# Patient Record
Sex: Male | Born: 1960 | Race: White | Hispanic: No | Marital: Single | Smoking: Current every day smoker
Health system: Southern US, Community
[De-identification: ages and names within clinical notes are randomized; demographics above are authoritative.]

## PROBLEM LIST (undated history)

## (undated) DIAGNOSIS — R42 Dizziness and giddiness: Secondary | ICD-10-CM

## (undated) DIAGNOSIS — C3490 Malignant neoplasm of unspecified part of unspecified bronchus or lung: Secondary | ICD-10-CM

## (undated) DIAGNOSIS — C719 Malignant neoplasm of brain, unspecified: Secondary | ICD-10-CM

## (undated) HISTORY — DX: Malignant neoplasm of brain, unspecified: C71.9

## (undated) HISTORY — DX: Malignant neoplasm of unspecified part of unspecified bronchus or lung: C34.90

---

## 1999-08-19 ENCOUNTER — Ambulatory Visit (HOSPITAL_COMMUNITY): Admission: RE | Admit: 1999-08-19 | Discharge: 1999-08-19 | Payer: Self-pay | Admitting: Family Medicine

## 1999-08-19 ENCOUNTER — Encounter: Payer: Self-pay | Admitting: Family Medicine

## 2014-09-10 ENCOUNTER — Ambulatory Visit (INDEPENDENT_AMBULATORY_CARE_PROVIDER_SITE_OTHER): Payer: Managed Care, Other (non HMO) | Admitting: Family Medicine

## 2014-09-10 VITALS — BP 136/70 | HR 74 | Temp 98.0°F | Resp 16 | Ht 68.0 in | Wt 148.0 lb

## 2014-09-10 DIAGNOSIS — J0100 Acute maxillary sinusitis, unspecified: Secondary | ICD-10-CM

## 2014-09-10 DIAGNOSIS — R42 Dizziness and giddiness: Secondary | ICD-10-CM

## 2014-09-10 DIAGNOSIS — H6503 Acute serous otitis media, bilateral: Secondary | ICD-10-CM

## 2014-09-10 MED ORDER — LEVOFLOXACIN 500 MG PO TABS: 500.0000 mg | ORAL_TABLET | Freq: Every day | ORAL | Status: DC

## 2014-09-10 NOTE — Progress Notes (Signed)
Patient ID: RHYAN RADLER MRN: [id number], DOB: 1961-02-28, 53 y.o. Date of Encounter: 09/10/2014, 1:04 PM  Primary Physician: No primary care provider on file.  Chief Complaint:  Chief Complaint  Patient presents with  . Ear Fullness    x 2 weeks  . Dizziness    HPI: 53 y.o. year old male presents with 7 day history of nasal congestion, post nasal drip, sore throat, sinus pressure, mild dysequilibrium, and muffled hearing. Afebrile. No chills. Nasal congestion thick and green/yellow. Sinus pressure is the worst symptom. . Ears feel full, leading to sensation of muffled hearing. Has tried OTC cold preps without success. No GI complaints.   He works for [employer], [employer] which sometimes requires going up ladders.  He has felt unstable on the ladders lately  No recent antibiotics, recent travels, vomiting, or sick contacts   No leg trauma, sedentary periods, h/o cancer, or tobacco use.  No past medical history on file.   Home Meds: Prior to Admission medications   Medication Sig Start Date End Date Taking? Authorizing Provider  levofloxacin (LEVAQUIN) 500 MG tablet Take 1 tablet (500 mg total) by mouth daily. 09/10/14   Robyn Haber, MD    Allergies:  Allergies  Allergen Reactions  . Penicillins     History   Social History  . Marital Status: Divorced    Spouse Name: N/A    Number of Children: N/A  . Years of Education: N/A   Occupational History  . Not on file.   Social History Main Topics  . Smoking status: Never Smoker   . Smokeless tobacco: Not on file  . Alcohol Use: Not on file  . Drug Use: Not on file  . Sexual Activity: Not on file   Other Topics Concern  . Not on file   Social History Narrative  . No narrative on file     Review of Systems: Constitutional: negative for chills, fever, night sweats or weight changes Cardiovascular: negative for chest pain or palpitations Respiratory: negative for hemoptysis, wheezing, or  shortness of breath Abdominal: negative for abdominal pain, nausea, vomiting or diarrhea Dermatological: negative for rash Neurologic: negative for headache   Physical Exam: Blood pressure 136/70, pulse 74, temperature 98 F (36.7 C), resp. rate 16, height 5\' 8"  (1.727 m), weight 148 lb (67.132 kg), SpO2 99 %., Body mass index is 22.51 kg/(m^2). General: Well developed, well nourished, in no acute distress. Head: Normocephalic, atraumatic, eyes without discharge, sclera non-icteric, nares are congested. Bilateral auditory canals clear, TM's are without perforation, pearly grey with reflective cone of light bilaterally. Serous effusion bilaterally behind TM's. Maxillary sinus TTP. Oral cavity moist, dentition normal. Posterior pharynx with post nasal drip and mild erythema. No peritonsillar abscess or tonsillar exudate. Neck: Supple. No thyromegaly. Full ROM. No lymphadenopathy. Lungs: Clear bilaterally to auscultation without wheezes, rales, or rhonchi. Breathing is unlabored.  Heart: RRR with S1 S2. No murmurs, rubs, or gallops appreciated. Msk:  Strength and tone normal for age. Extremities: No clubbing or cyanosis. No edema. Neuro: Alert and oriented X 3. Moves all extremities spontaneously. CNII-XII grossly in tact.  CN III-XII intact.  Gait stable Psych:  Responds to questions appropriately with a normal affect.   Labs:   ASSESSMENT AND PLAN:  53 y.o. year old male with sinusitis -Acute maxillary sinusitis, recurrence not specified - Plan: levofloxacin (LEVAQUIN) 500 MG tablet, DISCONTINUED: levofloxacin (LEVAQUIN) 500 MG tablet  Bilateral acute serous otitis media, recurrence not specified - Plan: levofloxacin (LEVAQUIN)  500 MG tablet, DISCONTINUED: levofloxacin (LEVAQUIN) 500 MG tablet  I have recommended the patient not go on ladders and x-ray days. If he still having symptoms by Sunday, he still return for further evaluation.  -Tylenol/Motrin prn -Rest/fluids -RTC  precautions -RTC 3-5 days if no improvement  Signed, Robyn Haber, MD 09/10/2014 1:04 PM

## 2014-09-10 NOTE — Patient Instructions (Signed)
Serous Otitis Media Serous otitis media is fluid in the middle ear space. This space contains the bones for hearing and air. Air in the middle ear space helps to transmit sound.  The air gets there through the eustachian tube. This tube goes from the back of the nose (nasopharynx) to the middle ear space. It keeps the pressure in the middle ear the same as the outside world. It also helps to drain fluid from the middle ear space. CAUSES  Serous otitis media occurs when the eustachian tube gets blocked. Blockage can come from:  Ear infections.  Colds and other upper respiratory infections.  Allergies.  Irritants such as cigarette smoke.  Sudden changes in air pressure (such as descending in an airplane).  Enlarged adenoids.  A mass in the nasopharynx. During colds and upper respiratory infections, the middle ear space can become temporarily filled with fluid. This can happen after an ear infection also. Once the infection clears, the fluid will generally drain out of the ear through the eustachian tube. If it does not, then serous otitis media occurs. SIGNS AND SYMPTOMS   Hearing loss.  A feeling of fullness in the ear, without pain.  Young children may not show any symptoms but may show slight behavioral changes, such as agitation, ear pulling, or crying. DIAGNOSIS  Serous otitis media is diagnosed by an ear exam. Tests may be done to check on the movement of the eardrum. Hearing exams may also be done. TREATMENT  The fluid most often goes away without treatment. If allergy is the cause, allergy treatment may be helpful. Fluid that persists for several months may require minor surgery. A small tube is placed in the eardrum to:  Drain the fluid.  Restore the air in the middle ear space. In certain situations, antibiotic medicines are used to avoid surgery. Surgery may be done to remove enlarged adenoids (if this is the cause). HOME CARE INSTRUCTIONS   Keep children away from  tobacco smoke.  Keep all follow-up visits as directed by your health care provider. SEEK MEDICAL CARE IF:   Your hearing is not better in 3 months.  Your hearing is worse.  You have ear pain.  You have drainage from the ear.  You have dizziness.  You have serous otitis media only in one ear or have any bleeding from your nose (epistaxis).  You notice a lump on your neck. MAKE SURE YOU:  Understand these instructions.   Will watch your condition.   Will get help right away if you are not doing well or get worse.  Document Released: 12/23/2003 Document Revised: 02/16/2014 Document Reviewed: 04/29/2013 [facility] Patient Information 2015 [place], [place]. This information is not intended to replace advice given to you by your health care provider. Make sure you discuss any questions you have with your health care provider. Sinusitis Sinusitis is redness, soreness, and inflammation of the paranasal sinuses. Paranasal sinuses are air pockets within the bones of your face (beneath the eyes, the middle of the forehead, or above the eyes). In healthy paranasal sinuses, mucus is able to drain out, and air is able to circulate through them by way of your nose. However, when your paranasal sinuses are inflamed, mucus and air can become trapped. This can allow bacteria and other germs to grow and cause infection. Sinusitis can develop quickly and last only a short time (acute) or continue over a long period (chronic). Sinusitis that lasts for more than 12 weeks is considered chronic.  CAUSES  Causes of sinusitis include:  Allergies.  Structural abnormalities, such as displacement of the cartilage that separates your nostrils (deviated septum), which can decrease the air flow through your nose and sinuses and affect sinus drainage.  Functional abnormalities, such as when the small hairs (cilia) that line your sinuses and help remove mucus do not work properly or are not present. SIGNS AND  SYMPTOMS  Symptoms of acute and chronic sinusitis are the same. The primary symptoms are pain and pressure around the affected sinuses. Other symptoms include:  Upper toothache.  Earache.  Headache.  Bad breath.  Decreased sense of smell and taste.  A cough, which worsens when you are lying flat.  Fatigue.  Fever.  Thick drainage from your nose, which often is green and may contain pus (purulent).  Swelling and warmth over the affected sinuses. DIAGNOSIS  Your health care provider will perform a physical exam. During the exam, your health care provider may:  Look in your nose for signs of abnormal growths in your nostrils (nasal polyps).  Tap over the affected sinus to check for signs of infection.  View the inside of your sinuses (endoscopy) using an imaging device that has a light attached (endoscope). If your health care provider suspects that you have chronic sinusitis, one or more of the following tests may be recommended:  Allergy tests.  Nasal culture. A sample of mucus is taken from your nose, sent to a lab, and screened for bacteria.  Nasal cytology. A sample of mucus is taken from your nose and examined by your health care provider to determine if your sinusitis is related to an allergy. TREATMENT  Most cases of acute sinusitis are related to a viral infection and will resolve on their own within 10 days. Sometimes medicines are prescribed to help relieve symptoms (pain medicine, decongestants, nasal steroid sprays, or saline sprays).  However, for sinusitis related to a bacterial infection, your health care provider will prescribe antibiotic medicines. These are medicines that will help kill the bacteria causing the infection.  Rarely, sinusitis is caused by a fungal infection. In theses cases, your health care provider will prescribe antifungal medicine. For some cases of chronic sinusitis, surgery is needed. Generally, these are cases in which sinusitis recurs  more than 3 times per year, despite other treatments. HOME CARE INSTRUCTIONS   Drink plenty of water. Water helps thin the mucus so your sinuses can drain more easily.  Use a humidifier.  Inhale steam 3 to 4 times a day (for example, sit in the bathroom with the shower running).  Apply a warm, moist washcloth to your face 3 to 4 times a day, or as directed by your health care provider.  Use saline nasal sprays to help moisten and clean your sinuses.  Take medicines only as directed by your health care provider.  If you were prescribed either an antibiotic or antifungal medicine, finish it all even if you start to feel better. SEEK IMMEDIATE MEDICAL CARE IF:  You have increasing pain or severe headaches.  You have nausea, vomiting, or drowsiness.  You have swelling around your face.  You have vision problems.  You have a stiff neck.  You have difficulty breathing. MAKE SURE YOU:   Understand these instructions.  Will watch your condition.  Will get help right away if you are not doing well or get worse. Document Released: 10/02/2005 Document Revised: 02/16/2014 Document Reviewed: 10/17/2011 [facility] Patient Information 2015 [place], [place]. This information is not intended to replace  advice given to you by your health care provider. Make sure you discuss any questions you have with your health care provider.

## 2014-09-16 ENCOUNTER — Ambulatory Visit (INDEPENDENT_AMBULATORY_CARE_PROVIDER_SITE_OTHER): Payer: Managed Care, Other (non HMO) | Admitting: Family Medicine

## 2014-09-16 VITALS — BP 135/82 | HR 85 | Temp 99.1°F | Resp 18 | Wt 146.0 lb

## 2014-09-16 DIAGNOSIS — R42 Dizziness and giddiness: Secondary | ICD-10-CM

## 2014-09-16 DIAGNOSIS — H9319 Tinnitus, unspecified ear: Secondary | ICD-10-CM

## 2014-09-16 NOTE — Progress Notes (Signed)
Subjective:    Patient ID: Jason Keller, male    DOB: [birth year], 53 y.o.   MRN: [id number] Chief Complaint  Patient presents with  . Follow-up  . Dizziness    HPI 53 year old male smoker, and alcohol abuse, is returning after 7 days of levaquin for sinusitis and otitis media effusion after complaints of sinus pressure and dysequilibrium.  He states that though the equilibrium has improved some, he continues to have the constant roaring tinnitus, although there is definite intensity when he is driving and when he is in loud environments.  It may crescendo at any time of day with no rhyme or reason as well.  He states that he feels like his feet are huge, as though he is dragging, because he can't get his feet under him.  He denies fatigue or weakness with this.  Last night, he states he began to have chills, and went to bed around 5:30.  He has dizziness during the day.    He has been taking advil (600mg  twice a day) which helps.  He has also used an otc ear solution that helped, but he is unsure the name of the treatment.  He denies cp, palpitations, or leg swellings. He also states he feels tremulous.  He denies ever having withdrawal, but has only drank 2 beers last night from his normal 6.  He smokes about 1 pk/day.  Caffeine drinker of 1 cup of coffee.      Review of Systems ROS otherwise unremarkable unless listed above.      Objective:   Physical Exam  Constitutional: He is oriented to person, place, and time. He appears well-developed and well-nourished.  HENT:  Head: Normocephalic and atraumatic.  Nose: Nose normal. Right sinus exhibits no maxillary sinus tenderness and no frontal sinus tenderness. Left sinus exhibits no maxillary sinus tenderness and no frontal sinus tenderness.  Mouth/Throat: Oropharynx is clear and moist. No trismus in the jaw. No uvula swelling. No oropharyngeal exudate, posterior oropharyngeal edema or posterior oropharyngeal erythema.  Tympanic membranes  are a pearly grey.  Some cerumen speckled along the eardrum.  No effusions, injection, erythema or bulging bilaterally of both ears.   Nasal mucosa moist with no edema or rhinorrhea.    Eyes: Pupils are equal, round, and reactive to light. No scleral icterus.  Neck: Normal range of motion. No tracheal deviation present.  Right anterior adenopathy.    Cardiovascular: Normal rate, regular rhythm and normal heart sounds.  Exam reveals no gallop, no distant heart sounds and no friction rub.   No murmur heard. Pulmonary/Chest: Breath sounds normal. No accessory muscle usage. No apnea. No respiratory distress. He has no decreased breath sounds. He has no wheezes. He has no rhonchi.  Neurological: He is alert and oriented to person, place, and time. He displays a negative Romberg sign. Coordination normal.  No nystagmus with maneuvering.  Head thrust showed no trailing from target.   Nose-to-finger heel-to-shin completed successfully.  Heel-to-toe walk normal with some teetering without misstep.    Skin: Skin is warm and dry.       Assessment & Plan:  53 year old male with tobacco and alcohol abuse is here today regarding continued dysequilibrium and tinnitus for 2 weeks.    Reviewed this plan with Dr. Joseph Art.  He continues to have some dizziness, tinnitus, and balance issues after 6 days of a strong antibiotic.  At this time, his symptoms should resolved.  At this time, ent would  be preferred.  Diff dx: Effusion, labyrinthitis, meniere, neuronitis, .  Reluctant to give presumed diuretic with etoh hx, although his numerous lifestyle choices may be the cause of his slow recovery as well.  At this time, ent is appreciated to insure no hearing loss or other etiology.    Dizziness Tinnitus, unspecified laterality Ambulatory referral to ENT    Ivar Drape, PA-C [facility] 12/2/20152:28 PM

## 2014-10-08 ENCOUNTER — Emergency Department (HOSPITAL_COMMUNITY): Payer: PRIVATE HEALTH INSURANCE

## 2014-10-08 ENCOUNTER — Emergency Department (HOSPITAL_COMMUNITY)
Admission: EM | Admit: 2014-10-08 | Discharge: 2014-10-08 | Disposition: A | Discharge status: Home / Self Care | Payer: PRIVATE HEALTH INSURANCE | Attending: Emergency Medicine | Admitting: Emergency Medicine

## 2014-10-08 ENCOUNTER — Encounter (HOSPITAL_COMMUNITY): Payer: Self-pay | Admitting: Emergency Medicine

## 2014-10-08 DIAGNOSIS — R531 Weakness: Secondary | ICD-10-CM | POA: Insufficient documentation

## 2014-10-08 DIAGNOSIS — Z720 Tobacco use: Secondary | ICD-10-CM | POA: Diagnosis not present

## 2014-10-08 DIAGNOSIS — Z880 Allergy status to penicillin: Secondary | ICD-10-CM | POA: Diagnosis not present

## 2014-10-08 DIAGNOSIS — Z859 Personal history of malignant neoplasm, unspecified: Secondary | ICD-10-CM | POA: Insufficient documentation

## 2014-10-08 DIAGNOSIS — R42 Dizziness and giddiness: Secondary | ICD-10-CM

## 2014-10-08 DIAGNOSIS — R222 Localized swelling, mass and lump, trunk: Secondary | ICD-10-CM

## 2014-10-08 DIAGNOSIS — C7931 Secondary malignant neoplasm of brain: Secondary | ICD-10-CM | POA: Insufficient documentation

## 2014-10-08 DIAGNOSIS — R413 Other amnesia: Secondary | ICD-10-CM

## 2014-10-08 DIAGNOSIS — R930 Abnormal findings on diagnostic imaging of skull and head, not elsewhere classified: Secondary | ICD-10-CM | POA: Insufficient documentation

## 2014-10-08 HISTORY — DX: Dizziness and giddiness: R42

## 2014-10-08 LAB — CBC WITH DIFFERENTIAL/PLATELET
Basophils Absolute: 0 10*3/uL (ref 0.0–0.1)
Basophils Relative: 1 % (ref 0–1)
Eosinophils Absolute: 0.2 10*3/uL (ref 0.0–0.7)
Eosinophils Relative: 3 % (ref 0–5)
HCT: 41 % (ref 39.0–52.0)
HEMOGLOBIN: 14.3 g/dL (ref 13.0–17.0)
LYMPHS ABS: 2 10*3/uL (ref 0.7–4.0)
Lymphocytes Relative: 31 % (ref 12–46)
MCH: 31.1 pg (ref 26.0–34.0)
MCHC: 34.9 g/dL (ref 30.0–36.0)
MCV: 89.1 fL (ref 78.0–100.0)
MONOS PCT: 7 % (ref 3–12)
Monocytes Absolute: 0.5 10*3/uL (ref 0.1–1.0)
NEUTROS ABS: 3.9 10*3/uL (ref 1.7–7.7)
NEUTROS PCT: 58 % (ref 43–77)
Platelets: 208 10*3/uL (ref 150–400)
RBC: 4.6 MIL/uL (ref 4.22–5.81)
RDW: 12.6 % (ref 11.5–15.5)
WBC: 6.6 10*3/uL (ref 4.0–10.5)

## 2014-10-08 LAB — URINALYSIS, ROUTINE W REFLEX MICROSCOPIC
BILIRUBIN URINE: NEGATIVE
Glucose, UA: NEGATIVE mg/dL
HGB URINE DIPSTICK: NEGATIVE
Ketones, ur: NEGATIVE mg/dL
Leukocytes, UA: NEGATIVE
NITRITE: NEGATIVE
Protein, ur: NEGATIVE mg/dL
Specific Gravity, Urine: 1.004 — ABNORMAL LOW (ref 1.005–1.030)
Urobilinogen, UA: 0.2 mg/dL (ref 0.0–1.0)
pH: 6 (ref 5.0–8.0)

## 2014-10-08 LAB — COMPREHENSIVE METABOLIC PANEL
ALBUMIN: 3.9 g/dL (ref 3.5–5.2)
ALK PHOS: 44 U/L (ref 39–117)
ALT: 16 U/L (ref 0–53)
ANION GAP: 11 (ref 5–15)
AST: 18 U/L (ref 0–37)
BUN: 11 mg/dL (ref 6–23)
CO2: 21 mmol/L (ref 19–32)
Calcium: 9.2 mg/dL (ref 8.4–10.5)
Chloride: 106 mEq/L (ref 96–112)
Creatinine, Ser: 0.74 mg/dL (ref 0.50–1.35)
GFR calc non Af Amer: >90 mL/min (ref >90)
GLUCOSE: 90 mg/dL (ref 70–99)
POTASSIUM: 3.9 mmol/L (ref 3.5–5.1)
Sodium: 138 mmol/L (ref 135–145)
Total Bilirubin: 0.4 mg/dL (ref 0.3–1.2)
Total Protein: 6.5 g/dL (ref 6.0–8.3)

## 2014-10-08 MED ORDER — GADOBENATE DIMEGLUMINE 529 MG/ML IV SOLN
14.0000 mL | Freq: Once | INTRAVENOUS | Status: AC | PRN
  Administered 2014-10-08: 14 mL via INTRAVENOUS

## 2014-10-08 NOTE — Discharge Instructions (Signed)
1. Medications: usual home medications 2. Treatment: rest, drink plenty of fluids,  3. Follow Up: Please followup with [facility] or [facility] hematology [facility] oncology as soon as possible; please return to the emergency department for worsening symptoms, seizure-like activity, persistent headaches or changes in vision.

## 2014-10-08 NOTE — ED Notes (Signed)
Pt to ED for evaluation of ongoing memory loss for the past 4 months- pt states "I feel like I'm dragging".  Pt recently dx with Vertigo by ENT but states "I think it's something more".  Pt alert and oriented X 4 at present, denies numbness or tingling.

## 2014-10-08 NOTE — ED Notes (Signed)
Dr. Gentry at bedside. 

## 2014-10-08 NOTE — ED Notes (Signed)
Patient transported to CT 

## 2014-10-08 NOTE — ED Provider Notes (Signed)
CSN: [id number]     Arrival date & time 10/08/14  1843 History   First MD Initiated Contact with Patient 10/08/14 1852     Chief Complaint  Patient presents with  . Memory Loss     (Consider location/radiation/quality/duration/timing/severity/associated sxs/prior Treatment) The history is provided by the patient, medical records and a significant other. No language interpreter was used.     Jason Keller is a 53 y.o. male  with a hx of vertigo presents to the Emergency Department complaining of gradual, persistent, progressively worsening memory loss onset 4 months ago.  Pt reports he was evaluated at a local urgent care who diagnosed him with vertigo and sent him to be evaluated by ENT who confirm this diagnosis.  Patient denies room spinning, nausea but reports that his symptoms are more like disorientation. 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 Patient significant other reports the patient sometimes appears confused for several minutes at a time. With patient and significant other deny seizure activity, labile mood.  Patient reports that he feels very tired like he is "dragging." He reports sleeping poorly and significant other endorses symptoms consistent with sleep apnea. Patient denies headaches, neck pain, chest pain, shortness of breath, abdominal pain, nausea, vomiting, diarrhea, syncope, dysuria, hematuria.  Patient reports he is a smoker, currently smoking everyday. He reports chronic cough with this but unchanged.     Past Medical History  Diagnosis Date  . Vertigo    History reviewed. No pertinent past surgical history. No family history on file. History  Substance Use Topics  . Smoking status: Current Every Day Smoker  . Smokeless tobacco: Not on file  . Alcohol Use: 0.0 oz/week    0 Not specified per week    Review of Systems  Constitutional: Positive for fatigue. Negative for fever, diaphoresis, appetite change and unexpected weight  change.  HENT: Negative for mouth sores.   Eyes: Negative for visual disturbance.  Respiratory: Negative for cough, chest tightness, shortness of breath and wheezing.   Cardiovascular: Negative for chest pain.  Gastrointestinal: Negative for nausea, vomiting, abdominal pain, diarrhea and constipation.  Endocrine: Negative for polydipsia, polyphagia and polyuria.  Genitourinary: Negative for dysuria, urgency, frequency and hematuria.  Musculoskeletal: Negative for back pain and neck stiffness.  Skin: Negative for rash.  Allergic/Immunologic: Negative for immunocompromised state.  Neurological: Negative for syncope, light-headedness and headaches.       Disorientation  Hematological: Does not bruise/bleed easily.  Psychiatric/Behavioral: Positive for confusion. Negative for sleep disturbance. The patient is not nervous/anxious.       Allergies  Penicillins  Home Medications   Prior to Admission medications   Medication Sig Start Date End Date Taking? Authorizing Provider  meclizine (ANTIVERT) 12.5 MG tablet Take 12.5 mg by mouth 2 (two) times daily as needed for dizziness.   Yes Historical Provider, MD   BP 141/91 mmHg  Pulse 73  Temp(Src) 97.9 F (36.6 C) (Oral)  Resp 18  Ht 5\' 10"  (1.778 m)  Wt 146 lb (66.225 kg)  BMI 20.95 kg/m2  SpO2 97% Physical Exam  Constitutional: He is oriented to person, place, and time. He appears well-developed and well-nourished. No distress.  Awake, alert, nontoxic appearance  HENT:  Head: Normocephalic and atraumatic.  Mouth/Throat: Oropharynx is clear and moist. No oropharyngeal exudate.  Eyes: Conjunctivae and EOM are normal. Pupils are equal, round, and reactive to light. No scleral icterus.  No horizontal, vertical or rotational nystagmus  Neck: Normal  range of motion. Neck supple.  Full active and passive ROM without pain No midline or paraspinal tenderness No nuchal rigidity or meningeal signs  Cardiovascular: Normal rate, regular  rhythm, normal heart sounds and intact distal pulses.   No murmur heard. Pulmonary/Chest: Effort normal and breath sounds normal. No respiratory distress. He has no wheezes. He has no rales.  Equal chest expansion  Abdominal: Soft. Bowel sounds are normal. He exhibits no mass. There is no tenderness. There is no rebound and no guarding.  Musculoskeletal: Normal range of motion. He exhibits no edema.  Lymphadenopathy:    He has no cervical adenopathy.  Neurological: He is alert and oriented to person, place, and time. He has normal reflexes. No cranial nerve deficit. He exhibits normal muscle tone. Coordination normal.  Mental Status:  Alert, oriented, thought content appropriate. Speech fluent without evidence of aphasia. Able to follow 2 step commands without difficulty.  Cranial Nerves:  II:  Peripheral visual fields grossly normal, pupils equal, round, reactive to light III,IV, VI: ptosis not present, extra-ocular motions intact bilaterally  V,VII: smile symmetric, facial light touch sensation equal VIII: hearing grossly normal bilaterally  IX,X: gag reflex present  XI: bilateral shoulder shrug equal and strong XII: midline tongue extension  Motor:  5/5 in upper and lower extremities bilaterally including strong and equal grip strength and dorsiflexion/plantar flexion Sensory: Pinprick and light touch normal in all extremities.  Deep Tendon Reflexes: 2+ and symmetric  Cerebellar: normal finger-to-nose with bilateral upper extremities Gait: normal gait and balance CV: distal pulses palpable throughout   Skin: Skin is warm and dry. No rash noted. He is not diaphoretic. No erythema.  Psychiatric: He has a normal mood and affect. His behavior is normal. Judgment and thought content normal.  Nursing note and vitals reviewed.   ED Course  Procedures (including critical care time) Labs Review Labs Reviewed  URINALYSIS, ROUTINE W REFLEX MICROSCOPIC - Abnormal; Notable for the following:     Specific Gravity, Urine 1.004 (*)    All other components within normal limits  CBC WITH DIFFERENTIAL  COMPREHENSIVE METABOLIC PANEL    Imaging Review Dg Chest 2 View  10/08/2014   CLINICAL DATA:  Headache and dizziness for 6 weeks, decreased memory, feet tracking RIGHT greater than LEFT, smoker, weakness  EXAM: CHEST  2 VIEW  COMPARISON:  None  FINDINGS: Normal heart size, mediastinal contours and pulmonary vascularity.  Emphysematous and minimal bronchitic changes consistent with COPD.  RIGHT perihilar density suspicious for mass, area approximately 4.9 x 4.7 cm.  Remaining lungs clear.  No pleural effusion or pneumothorax.  IMPRESSION: COPD changes with suspicion of a perihilar mass in the RIGHT upper lobe 4.9 x 4.7 cm ; CT chest with contrast recommended for further evaluation.   Electronically Signed   By: Lavonia Dana M.D.   On: 10/08/2014 22:19   Ct Head Wo Contrast  10/08/2014   CLINICAL DATA:  Frontal headache for 3 months, dizziness for 3 months  EXAM: CT HEAD WITHOUT CONTRAST  TECHNIQUE: Contiguous axial images were obtained from the base of the skull through the vertex without intravenous contrast.  COMPARISON:  None.  FINDINGS: Areas of extensive white matter hypodensity are noted bilaterally. There is a possible associated 2.1 cm mass with adjacent white matter edema in the right occipital lobe image 17. There is mild subjective adjacent sulcal effacement. No midline shift. No acute hemorrhage. Evidence of scleral banding noted. No skull fracture. Moderate pansinusitis. No lytic or sclerotic osseous lesion.  IMPRESSION: Multiple bilateral areas of white matter presumed edema with suspicion for underlying mass lesions, statistically most likely metastatic disease. No acute hemorrhage. Infection or inflammation is less likely but possible. Further evaluation with brain MRI with contrast is recommended.  These results were called by telephone at the time of interpretation on 10/08/2014 at  8:21 pm to Dr. Abigail Butts , who verbally acknowledged these results.   Electronically Signed   By: Conchita Paris M.D.   On: 10/08/2014 20:23   Mr Jeri Cos ZO Contrast  10/08/2014   CLINICAL DATA:  Initial evaluation for memory loss with vertigo. Abnormal CT earlier today. History of smoking.  EXAM: MRI HEAD WITHOUT AND WITH CONTRAST  TECHNIQUE: Multiplanar, multiecho pulse sequences of the brain and surrounding structures were obtained without and with intravenous contrast.  CONTRAST:  64mL MULTIHANCE GADOBENATE DIMEGLUMINE 529 MG/ML IV SOLN  COMPARISON:  Prior CT from earlier the same day.  FINDINGS: Multiple abnormal heterogeneously enhancing mass is seen scattered throughout the brain, most consistent with intracranial metastases. These lesions are seen as follows:  Largest lesion located within the periatrial white matter in the right temporal parietal region measuring 3.5 x 1.9 x 2.7 cm (series 12, image 29). There is an adjacent lesion just inferiorly measuring 2.2 x 2.7 x 1.6 cm. There is extensive vasogenic edema about these lesions with partial effacement of the right lateral ventricle.  Lesion within the parasagittal left frontal lobe near the vertex measures 2.1 x 2.1 x 2.8 cm (series 12, image 45). Extensive vasogenic edema present about this lesion as well.  1.9 x 1.9 x 1.8 cm lesion present within the peripheral right frontal lobe towards the vertex (series 12, image 40).  1.4 x 1.9 x 1.3 cm lesion in the right frontoparietal region (series 2, image 41). Probable additional punctate lesions more within the left parietal lobe (series 12, image 43) and left frontoparietal region (series 12, image 36). There is a 4 mm lesion more anteriorly within the left frontal lobe (series 12, image 31). Faint punctate enhancement within the anterior right frontal lobe on series 12, image 29 also suspicious for metastatic lesion.  1.2 x 1.6 x 1.6 cm lesion present within the left occipital lobe (series  12, image 22).  11 mm lesion present within the left cerebellar vermis just to the left of midline (series 12, image 19).  8 mm lesion present with at the inferomedial left cerebellar hemisphere (series 12, image 10).  The right periatrial and left frontal lobe lesions demonstrate central necrosis with associated restricted diffusion. Chronic blood products seen within several of these lesions on gradient echo sequence.  There is mild 3-4 mm of right-to-left midline shift at the level of the septum pellucidum. No hydrocephalus. Basilar cisterns remain patent.  No acute large vessel territory infarct.  Craniocervical junction within normal limits. Pituitary gland normal. Visualized upper cervical spine unremarkable.  Bone marrow signal intensity is within normal limits. No definite calvarial or osseous lesions.  No acute abnormality seen about the orbits.  Scattered mucoperiosteal thickening present within the ethmoidal air cells and maxillary sinuses. There is partial opacification with air-fluid level within knee inferior right frontal sinus. Mild opacity present within the left sphenoid sinus. No mastoid effusion.  IMPRESSION: 1. Multiple intracranial metastases with associated vasogenic edema as detailed above. 2. 3-4 mm of right-to-left midline shift.  No hydrocephalus. 3. Paranasal sinus disease as above. Air-fluid level within the right frontal sinus suggest active sinus infection.   Electronically Signed  By: Jeannine Boga M.D.   On: 10/08/2014 22:18     EKG Interpretation None      MDM   Final diagnoses:  Dizziness  Weakness  Memory loss  Abnormal head CT  Metastatic cancer to brain  Mass in chest    Jason Keller presents with vague symptoms of disorientation, confusion and feeling tired. These are perhaps secondary to his sleep apnea however I'm concerned about potential CVA symptoms. Will obtain CT without contrast, basic labs, chest x-ray and reassess.  8:35PM CT with  multiple bilateral areas of white matter edema with suspicion for underlying mass lesions concerning for metastatic disease. Discussed with patient he denies known history of cancer but at this time does endorse a 9 pound weight loss in the last 4 months along with persistent night sweats.  Will obtain MRI brain. Labs reassuring and chest x-ray pending.  10:35PM Chest x-ray with COPD changes and perihilar mass of the right upper lobe.  MRI with multiple intracranial metastasis with associated vasogenic edema, 3-4 mm of right-to-left midline shift without hydrocephalus.  Findings of chest x-ray and MRI are discussed with patient and family. Patient's been offered admission to the hospital for further evaluation. He declines at this time. Patient has been instructed to follow-up as soon as possible with oncology. He's been given resources to do so. Patient is alert, oriented, nontoxic and nonseptic appearing. He remains without focal neurologic deficit and without seizure activity here in the emergency department. Patient cautioned about the potential for seizure activity and reasons to return to the emergency department.  I have personally reviewed patient's vitals, nursing note and any pertinent labs or imaging.  I performed an undressed physical exam.    It has been determined that no acute conditions requiring further emergency intervention are present at this time. The patient/guardian have been advised of the diagnosis and plan. I reviewed all labs and imaging including any potential incidental findings. We have discussed signs and symptoms that warrant return to the ED and they are listed in the discharge instructions.    Vital signs are stable at discharge.   BP 141/91 mmHg  Pulse 73  Temp(Src) 97.9 F (36.6 C) (Oral)  Resp 18  Ht 5\' 10"  (1.778 m)  Wt 146 lb (66.225 kg)  BMI 20.95 kg/m2  SpO2 97%   The patient was discussed with and seen by Dr. Colin Rhein who agrees with the treatment  plan.     Jarrett Soho Aniela Caniglia, PA-C 10/09/14 0001  Debby Freiberg, MD 10/12/14 [phone]

## 2014-10-08 NOTE — ED Notes (Signed)
Gave pt a urinal to provide [place] a urinal sample. Informed pt to hit the call button when he's ready for it to be collected.

## 2014-10-14 ENCOUNTER — Ambulatory Visit (INDEPENDENT_AMBULATORY_CARE_PROVIDER_SITE_OTHER): Payer: PRIVATE HEALTH INSURANCE | Admitting: Internal Medicine

## 2014-10-14 ENCOUNTER — Encounter: Payer: Self-pay | Admitting: Internal Medicine

## 2014-10-14 VITALS — BP 120/84 | HR 77 | Ht 70.0 in | Wt 147.0 lb

## 2014-10-14 DIAGNOSIS — R918 Other nonspecific abnormal finding of lung field: Secondary | ICD-10-CM | POA: Insufficient documentation

## 2014-10-14 DIAGNOSIS — Z720 Tobacco use: Secondary | ICD-10-CM

## 2014-10-14 DIAGNOSIS — C7931 Secondary malignant neoplasm of brain: Secondary | ICD-10-CM

## 2014-10-14 DIAGNOSIS — F17210 Nicotine dependence, cigarettes, uncomplicated: Secondary | ICD-10-CM

## 2014-10-14 MED ORDER — PANTOPRAZOLE SODIUM 40 MG PO TBEC: 40.0000 mg | DELAYED_RELEASE_TABLET | Freq: Every day | ORAL | Status: DC

## 2014-10-14 MED ORDER — FAMOTIDINE 20 MG PO TABS: ORAL_TABLET | ORAL | Status: DC

## 2014-10-14 MED ORDER — HYDROCODONE-ACETAMINOPHEN 5-325 MG PO TABS: 1.0000 | ORAL_TABLET | ORAL | Status: DC | PRN

## 2014-10-14 MED ORDER — DEXAMETHASONE 4 MG PO TABS: 4.0000 mg | ORAL_TABLET | Freq: Four times a day (QID) | ORAL | Status: DC

## 2014-10-14 NOTE — Progress Notes (Signed)
   Subjective:    Patient ID: Jason Keller, male    DOB: Feb 12, 1961,   MRN: [id number]  HPI  53 yowm smoker with min dry cough chronically and new onset veritigo x 06/2014 > to ER xmas eve with HA > MRI pos head mets and R hilar/Upper lobe mass on cxr refeerred by [place] to pulmonary clinic.  10/14/2014 1st [facility] office visit/ Jason Keller   Chief Complaint  Patient presents with  . Pulmonary Consult    Self referral for eval of Lung Mass. Pt c/o loss of balance, non prod cough and unintended wk loss for the past several months.   no sob or hemoptysis, cough was indolent in onset x 6 m persistent daytime urge to clear throat s hoarseness Ha in am x sev week indolent progressively worse, also now noct and at hs but no  Vomiting or viz co's  No obvious other patterns in day to day or daytime variabilty or assoc   cp or chest tightness, subjective wheeze overt sinus or hb symptoms. No unusual exp hx or h/o childhood pna/ asthma or knowledge of premature birth.  Sleeping ok without nocturnal  or early am exacerbation  of respiratory  c/o's or need for noct saba. Also denies any obvious fluctuation of symptoms with weather or environmental changes or other aggravating or alleviating factors except as outlined above   Current Medications, Allergies, Complete Past Medical History, Past Surgical History, Family History, and Social History were reviewed in [employer] record.            Review of Systems  Constitutional: Positive for appetite change and unexpected weight change. Negative for fever, chills and activity change.  HENT: Negative for congestion, dental problem, postnasal drip, rhinorrhea, sneezing, sore throat, trouble swallowing and voice change.   Eyes: Negative for visual disturbance.  Respiratory: Positive for cough. Negative for choking and shortness of breath.   Cardiovascular: Negative for chest pain and leg swelling.  Gastrointestinal:  Negative for nausea, vomiting and abdominal pain.  Genitourinary: Negative for difficulty urinating.  Musculoskeletal: Negative for arthralgias.  Skin: Negative for rash.  Psychiatric/Behavioral: Positive for confusion. Negative for behavioral problems.       Objective:   Physical Exam  Amb wm nad with freq throat clearing   Wt Readings from Last 3 Encounters:  10/14/14 147 lb (66.679 kg)  10/08/14 146 lb (66.225 kg)  09/16/14 146 lb (66.225 kg)    Vital signs reviewed  HEENT: nl dentition, turbinates, and orophanx. Nl external ear canals without cough reflex   NECK :  without JVD/Nodes/TM/ nl carotid upstrokes bilaterally   LUNGS: no acc muscle use, localized wheeze on R anteriorly    CV:  RRR  no s3 or murmur or increase in P2, no edema   ABD:  soft and nontender with nl excursion in the supine position. No bruits or organomegaly, bowel sounds nl  MS:  warm without deformities, calf tenderness, cyanosis - Mod clubbing both hands  SKIN: warm and dry without lesions    NEURO:  alert, approp, no deficits    cxr 10/08/14  COPD changes with suspicion of a perihilar mass in the RIGHT upper lobe 4.9 x 4.7 cm ; CT chest with contrast recommended for further evaluation.    Assessment & Plan:

## 2014-10-14 NOTE — Assessment & Plan Note (Signed)

## 2014-10-14 NOTE — Patient Instructions (Addendum)
No driving or working until further notice  Dexamethasone 4 mg 4x daily   Pantoprazole (protonix) 40 mg   Take 30-60 min before first meal of the day and Pepcid 20 mg one bedtime    As needed for pain try norco-5mg    take Up to one every 4 hours as needed also works for cough   Please see patient coordinator before you leave today  to schedule CT with contrast then we set up for a Bronchoscopy.

## 2014-10-14 NOTE — Assessment & Plan Note (Signed)
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

## 2014-10-14 NOTE — Assessment & Plan Note (Signed)
HA's in am and hs c/w elevated icp   Dex 4 mg qid 10/14/2014 >>> Prn vicodin 5 mg q 4h

## 2014-10-22 ENCOUNTER — Ambulatory Visit (INDEPENDENT_AMBULATORY_CARE_PROVIDER_SITE_OTHER)
Admission: RE | Admit: 2014-10-22 | Discharge: 2014-10-22 | Disposition: A | Discharge status: Home / Self Care | Payer: PRIVATE HEALTH INSURANCE | Source: Ambulatory Visit | Attending: Internal Medicine | Admitting: Internal Medicine

## 2014-10-22 DIAGNOSIS — R918 Other nonspecific abnormal finding of lung field: Secondary | ICD-10-CM

## 2014-10-22 MED ORDER — IOHEXOL 300 MG/ML  SOLN
80.0000 mL | Freq: Once | INTRAMUSCULAR | Status: AC | PRN
  Administered 2014-10-22: 80 mL via INTRAVENOUS

## 2014-10-23 ENCOUNTER — Telehealth: Payer: Self-pay | Admitting: Internal Medicine

## 2014-10-23 DIAGNOSIS — R918 Other nonspecific abnormal finding of lung field: Secondary | ICD-10-CM

## 2014-10-23 MED ORDER — HYDROCODONE-ACETAMINOPHEN 5-325 MG PO TABS: 1.0000 | ORAL_TABLET | ORAL | Status: DC | PRN

## 2014-10-23 NOTE — Telephone Encounter (Signed)
rx has been printed out and signed by MW.   This rx has been placed up front and he is aware that this is ready to be picked up .  Nothing further is needed.

## 2014-10-23 NOTE — Telephone Encounter (Signed)
Ok with me 

## 2014-10-23 NOTE — Telephone Encounter (Signed)
Spoke with pt, is requesting a refill on hydrocodone- refilled 10/14/14 #40 1 tab q4h prn pain.    Pt would be able to pick up this script on Monday.  Dr Melvyn Novas are you ok with this refill?  Thanks!

## 2014-10-26 ENCOUNTER — Encounter (HOSPITAL_COMMUNITY)
Admission: RE | Disposition: A | Discharge status: Home / Self Care | Payer: Self-pay | Source: Ambulatory Visit | Attending: Internal Medicine

## 2014-10-26 ENCOUNTER — Ambulatory Visit (HOSPITAL_COMMUNITY)
Admission: RE | Admit: 2014-10-26 | Discharge: 2014-10-26 | Disposition: A | Discharge status: Home / Self Care | Payer: PRIVATE HEALTH INSURANCE | Source: Ambulatory Visit | Attending: Internal Medicine | Admitting: Internal Medicine

## 2014-10-26 ENCOUNTER — Telehealth: Payer: Self-pay | Admitting: Internal Medicine

## 2014-10-26 DIAGNOSIS — R918 Other nonspecific abnormal finding of lung field: Secondary | ICD-10-CM | POA: Diagnosis present

## 2014-10-26 DIAGNOSIS — C7931 Secondary malignant neoplasm of brain: Secondary | ICD-10-CM | POA: Diagnosis not present

## 2014-10-26 DIAGNOSIS — F17210 Nicotine dependence, cigarettes, uncomplicated: Secondary | ICD-10-CM | POA: Insufficient documentation

## 2014-10-26 HISTORY — PX: VIDEO BRONCHOSCOPY: SHX5072

## 2014-10-26 SURGERY — VIDEO BRONCHOSCOPY WITHOUT FLUORO
Anesthesia: Moderate Sedation | Laterality: Bilateral

## 2014-10-26 MED ORDER — LIDOCAINE HCL 1 % IJ SOLN
INTRAMUSCULAR | Status: DC | PRN
  Administered 2014-10-26: 6 mL via RESPIRATORY_TRACT

## 2014-10-26 MED ORDER — MIDAZOLAM HCL 10 MG/2ML IJ SOLN
INTRAMUSCULAR | Status: AC
  Filled 2014-10-26: qty 4

## 2014-10-26 MED ORDER — MIDAZOLAM HCL 10 MG/2ML IJ SOLN
INTRAMUSCULAR | Status: DC | PRN
  Administered 2014-10-26: 2.5 mg via INTRAVENOUS
  Administered 2014-10-26: 5 mg via INTRAVENOUS

## 2014-10-26 MED ORDER — DEXAMETHASONE 4 MG PO TABS: 4.0000 mg | ORAL_TABLET | Freq: Four times a day (QID) | ORAL | Status: DC

## 2014-10-26 MED ORDER — LIDOCAINE HCL 2 % EX GEL: 1.0000 "application " | Freq: Once | CUTANEOUS | Status: DC

## 2014-10-26 MED ORDER — MEPERIDINE HCL 25 MG/ML IJ SOLN
INTRAMUSCULAR | Status: DC | PRN
  Administered 2014-10-26: 50 mg via INTRAVENOUS

## 2014-10-26 MED ORDER — MIDAZOLAM HCL 5 MG/ML IJ SOLN: 1.0000 mg | Freq: Once | INTRAMUSCULAR | Status: DC

## 2014-10-26 MED ORDER — SODIUM CHLORIDE 0.9 % IV SOLN
INTRAVENOUS | Status: DC
  Administered 2014-10-26: 13:00:00 via INTRAVENOUS

## 2014-10-26 MED ORDER — MEPERIDINE HCL 100 MG/ML IJ SOLN: 100.0000 mg | Freq: Once | INTRAMUSCULAR | Status: DC

## 2014-10-26 MED ORDER — PHENYLEPHRINE HCL 0.25 % NA SOLN: 1.0000 | Freq: Four times a day (QID) | NASAL | Status: DC | PRN

## 2014-10-26 MED ORDER — MEPERIDINE HCL 100 MG/ML IJ SOLN
INTRAMUSCULAR | Status: AC
  Filled 2014-10-26: qty 2

## 2014-10-26 MED ORDER — LIDOCAINE HCL 2 % EX GEL
CUTANEOUS | Status: DC | PRN
  Administered 2014-10-26: 1

## 2014-10-26 MED ORDER — PHENYLEPHRINE HCL 0.25 % NA SOLN
NASAL | Status: DC | PRN
  Administered 2014-10-26: 2 via NASAL

## 2014-10-26 NOTE — Telephone Encounter (Signed)
Pt requesting Decadron and Norco Rx Last seen 10/14/14 Norco #40 printed on 10/23/14 -  Up front for pick up Decradron #60 last filled 10/14/14; pt is requesting refill of this today.   Spoke with Dr Melvyn Novas, okay for Decadron to be refilled to pharmacy. This has been sent to [facility] and patient has picked up Norco rx. Nothing further needed.

## 2014-10-26 NOTE — Op Note (Signed)
Bronchoscopy Procedure Note  Date of Operation: 10/26/2014   Pre-op Diagnosis: lung mass  Post-op Diagnosis: same  Surgeon: [name]  Anesthesia: Monitored Local Anesthesia with Sedation  Operation: Video Flexible fiberoptic bronchoscopy, diagnostic   Findings: RUL ant segment completely obst by friable mass  Specimen: washings/ endobronchial bx RUL ant segment  Estimated Blood Loss: min  Complications: none  Indications and History: See updated H and P same date. The risks, benefits, complications, treatment options and expected outcomes were discussed with the patient.  The possibilities of reaction to medication, pulmonary aspiration, perforation of a viscus, bleeding, failure to diagnose a condition and creating a complication requiring transfusion or operation were discussed with the patient who freely signed the consent.    Description of Procedure: The patient was re-examined in the bronchoscopy suite and the site of surgery properly noted/marked.  The patient was identified  and the procedure verified as Flexible Fiberoptic Bronchoscopy.  A Time Out was held and the above information confirmed.   After the induction of topical nasopharyngeal anesthesia, the patient was positioned  and the bronchoscope was passed through the R naris. The vocal cords were visualized and  1% buffered lidocaine 5 ml was topically placed onto the cords. The cords were NL. The scope was then passed into the trachea.  1% buffered lidocaine given topically. Airways inspected bilaterally to the subsegmental level with the following findings:  Nl airways except for RUL ant segment 100% obst friable mass    Interventions:  Lavage RUL Endobronchial bx 6 RUL orifice      The Patient was taken to the Endoscopy Recovery area in satisfactory condition.  Attestation: I performed the procedure.  [name], MD Pulmonary and [place] [phone] After 5:30  PM or weekends, call [phone]

## 2014-10-26 NOTE — H&P (Signed)
86 yowm smoker with min dry cough chronically and new onset veritigo x 06/2014 > to ER xmas eve with HA > MRI pos head mets and R hilar/Upper lobe mass on cxr referred by [place] to pulmonary clinic.  10/14/2014 1st [facility] office visit/ Jason Keller  Chief Complaint  Patient presents with  . Pulmonary Consult    Self referral for eval of Lung Mass. Pt c/o loss of balance, non prod cough and unintended wk loss for the past several months.   no sob or hemoptysis, cough was indolent in onset x 6 m persistent daytime urge to clear throat s hoarseness Ha in am x sev week indolent progressively worse, also now noct and at hs but no Vomiting or viz co's  No obvious other patterns in day to day or daytime variabilty or assoc cp or chest tightness, subjective wheeze overt sinus or hb symptoms. No unusual exp hx or h/o childhood pna/ asthma or knowledge of premature birth.  Sleeping ok without nocturnal or early am exacerbation of respiratory c/o's or need for noct saba. Also denies any obvious fluctuation of symptoms with weather or environmental changes or other aggravating or alleviating factors except as outlined above   Current Medications, Allergies, Complete Past Medical History, Past Surgical History, Family History, and Social History were reviewed in [employer] record.       Review of Systems  Constitutional: Positive for appetite change and unexpected weight change. Negative for fever, chills and activity change.  HENT: Negative for congestion, dental problem, postnasal drip, rhinorrhea, sneezing, sore throat, trouble swallowing and voice change.  Eyes: Negative for visual disturbance.  Respiratory: Positive for cough. Negative for choking and shortness of breath.  Cardiovascular: Negative for chest pain and leg swelling.  Gastrointestinal: Negative for nausea, vomiting and abdominal pain.  Genitourinary: Negative for difficulty urinating.    Musculoskeletal: Negative for arthralgias.  Skin: Negative for rash.  Psychiatric/Behavioral: Positive for confusion. Negative for behavioral problems.       Objective:   Physical Exam  Amb wm nad with freq throat clearing   Wt Readings from Last 3 Encounters:  10/14/14 147 lb (66.679 kg)  10/08/14 146 lb (66.225 kg)  09/16/14 146 lb (66.225 kg)    Vital signs reviewed  HEENT: nl dentition, turbinates, and orophanx. Nl external ear canals without cough reflex   NECK : without JVD/Nodes/TM/ nl carotid upstrokes bilaterally   LUNGS: no acc muscle use, localized wheeze on R anteriorly    CV: RRR no s3 or murmur or increase in P2, no edema   ABD: soft and nontender with nl excursion in the supine position. No bruits or organomegaly, bowel sounds nl  MS: warm without deformities, calf tenderness, cyanosis - Mod clubbing both hands  SKIN: warm and dry without lesions   NEURO: alert, approp, no deficits   cxr 10/08/14  COPD changes with suspicion of a perihilar mass in the RIGHT upper lobe 4.9 x 4.7 cm ; CT chest with contrast recommended for further evaluation.    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             Brain metastases -  Tanda Rockers, MD at 10/14/2014 4:25 PM     Status: Written Related Problem: Brain metastases   Expand All Collapse All   HA's in am and hs c/w elevated icp   Dex 4 mg qid 10/14/2014 >>> Prn vicodin 5 mg q 4h             Cigarette smoker - Tanda Rockers, MD at 10/14/2014 4:27 PM     Status: Written Related Problem: Cigarette smoker   Expand All Collapse All   > 3 min discussion I emphasized that although we never turn away smokers from the pulmonary clinic, we do ask that they understand that the recommendations that we make won't work nearly as well in the presence of continued cigarette exposure.  In fact, we may very well reach a point where we can't promise to help the patient if he/she can't quit smoking. (We can and will promise to try to help, we just can't promise what we recommend will really work)         10/26/2014 day of FOB - reports ha better  On rx, no hemoptysis, no change hx or exam otherwise    [name], MD Pulmonary and [place] [phone] After 5:30 PM or weekends, call [phone]

## 2014-10-26 NOTE — Discharge Instructions (Signed)
Flexible Bronchoscopy, Care After These instructions give you information on caring for yourself after your procedure. Your doctor may also give you more specific instructions. Call your doctor if you have any problems or questions after your procedure. HOME CARE  Do not eat or drink anything for 2 hours after your procedure. If you try to eat or drink before the medicine wears off, food or drink could go into your lungs. You could also burn yourself.  After 2 hours have passed and when you can cough and gag normally, you may eat soft food and drink liquids slowly.  The day after the test, you may eat your normal diet.  You may do your normal activities.  Keep all doctor visits. GET HELP RIGHT AWAY IF:  You get more and more short of breath.  You get light-headed.  You feel like you are going to pass out (faint).  You have chest pain.  You have new problems that worry you.  You cough up more than a little blood.  You cough up more blood than before. MAKE SURE YOU:  Understand these instructions.  Will watch your condition.  Will get help right away if you are not doing well or get worse. Document Released: 07/30/2009 Document Revised: 10/07/2013 Document Reviewed: 06/06/2013 [facility] Patient Information 2015 [place], [place]. This information is not intended to replace advice given to you by your health care provider. Make sure you discuss any questions you have with your health care provider.   Do not eat or drink until after 1510 today 10/26/2014

## 2014-10-26 NOTE — Progress Notes (Signed)
Video bronchoscopy performed Intervention bronchial washings Intervention bronchial biopsy Pt tolerated well  Kathie Dike RRT

## 2014-10-27 ENCOUNTER — Encounter (HOSPITAL_COMMUNITY): Payer: Self-pay | Admitting: Internal Medicine

## 2014-10-28 ENCOUNTER — Other Ambulatory Visit: Payer: Self-pay | Admitting: Internal Medicine

## 2014-10-28 DIAGNOSIS — C7931 Secondary malignant neoplasm of brain: Secondary | ICD-10-CM

## 2014-10-28 DIAGNOSIS — R918 Other nonspecific abnormal finding of lung field: Secondary | ICD-10-CM

## 2014-10-28 NOTE — Progress Notes (Signed)
Quick Note:  Orders were sent to [facility] marked STAT ______

## 2014-10-29 ENCOUNTER — Telehealth: Payer: Self-pay | Admitting: *Deleted

## 2014-10-29 ENCOUNTER — Other Ambulatory Visit: Payer: Self-pay | Admitting: Radiation Therapy

## 2014-10-29 ENCOUNTER — Other Ambulatory Visit: Payer: Self-pay | Admitting: *Deleted

## 2014-10-29 DIAGNOSIS — C7931 Secondary malignant neoplasm of brain: Secondary | ICD-10-CM

## 2014-10-29 DIAGNOSIS — R918 Other nonspecific abnormal finding of lung field: Secondary | ICD-10-CM

## 2014-10-29 NOTE — Telephone Encounter (Signed)
Mont Dutton called from [facility] stating patient was set up with Dr. Lisbeth Renshaw.  I called patient to clarify.  He stated no he was not set up with Dr. Lisbeth Renshaw, he was set up with Dr. Tammi Klippel.  I stated again he has an appt with Dr. Julien Nordmann tomorrow.  He verbalized understanding.

## 2014-10-29 NOTE — Telephone Encounter (Signed)
Called patient to set up with Dr. Julien Nordmann on 10/30/14 arrival at 9:30.  Patient verbalized understanding of appt time and place.

## 2014-10-30 ENCOUNTER — Ambulatory Visit (HOSPITAL_BASED_OUTPATIENT_CLINIC_OR_DEPARTMENT_OTHER): Payer: PRIVATE HEALTH INSURANCE | Admitting: Internal Medicine

## 2014-10-30 ENCOUNTER — Ambulatory Visit: Payer: PRIVATE HEALTH INSURANCE

## 2014-10-30 ENCOUNTER — Telehealth: Payer: Self-pay | Admitting: Internal Medicine

## 2014-10-30 ENCOUNTER — Other Ambulatory Visit: Payer: PRIVATE HEALTH INSURANCE

## 2014-10-30 ENCOUNTER — Encounter: Payer: Self-pay | Admitting: Internal Medicine

## 2014-10-30 ENCOUNTER — Encounter: Payer: Self-pay | Admitting: *Deleted

## 2014-10-30 VITALS — BP 141/84 | HR 80 | Temp 98.6°F | Resp 18 | Ht 70.0 in | Wt 155.2 lb

## 2014-10-30 DIAGNOSIS — C3491 Malignant neoplasm of unspecified part of right bronchus or lung: Secondary | ICD-10-CM | POA: Insufficient documentation

## 2014-10-30 DIAGNOSIS — Z720 Tobacco use: Secondary | ICD-10-CM

## 2014-10-30 DIAGNOSIS — C7931 Secondary malignant neoplasm of brain: Secondary | ICD-10-CM

## 2014-10-30 DIAGNOSIS — C3411 Malignant neoplasm of upper lobe, right bronchus or lung: Secondary | ICD-10-CM

## 2014-10-30 NOTE — Progress Notes (Signed)
Checked in new pt with no financial concerns prior to seeing the dr.  I informed pt that if chemo is part of his treatment I will contact his ins to see if Josem Kaufmann is req and will obtain that if it is as well as contact foundations that offer copay assistance for chemo if needed.  He has my card for any billing or insurance questions or concerns

## 2014-10-30 NOTE — [facility] Oncology Navigator Note (Unsigned)
I spoke with patient today at [facility].  I sat with him and his wife during Dr. Worthy Flank consultation.  After Dr. Julien Nordmann completed consultation and office visit, I sat with them and explained more about lung cancer and provided educational materials.  I also gave him 15 minutes smoking cessation education.  He verbalized understanding and will commit to brand switching and then set a quit date.    I also requested pathology to send tissue obtained from bronch to [facility].

## 2014-10-30 NOTE — Progress Notes (Signed)
[place] [phone]   [phone] Thoracic oncology clinic  CONSULT NOTE  REFERRING PHYSICIAN: Dr. [name]  REASON FOR CONSULTATION:  54 years old white male recently diagnosed with lung cancer.  HPI Jason Keller is a 54 y.o. male with no significant past medical history but long history of smoking. The patient mentions that has been complaining of imbalance in his gait and memory loss for few weeks. He was seen at [facility] and was referred to ENT for evaluation. He was seen by ENT but was treated for vertigo with no significant improvement. On 10/08/2014, the patient presented to the emergency department at [facility] complaining of the same symptoms including the imbalanced gait, memory loss, nonproductive cough as well as weight loss. During his evaluation CT scan of the head followed by MRI of the brain were performed and it showed Multiple abnormal heterogeneously enhancing mass is seen scattered throughout the brain, most consistent with intracranial metastases.These lesions are seen as follows: Largest lesion located within the periatrial white matter in the right temporal parietal region measuring 3.5 x 1.9 x 2.7 cm. There is an adjacent lesion just inferiorly measuring 2.2 x 2.7 x 1.6 cm. There is extensive vasogenic edema about these lesions with partial effacement of the right lateral ventricle. Lesion within the parasagittal left frontal lobe near the vertex measures 2.1 x 2.1 x 2.8 cm. Extensive vasogenic edema present about this lesion as well. 1.9 x 1.9 x 1.8 cm lesion present within the peripheral right frontal lobe towards the vertex. 1.4 x 1.9 x 1.3 cm lesion in the right frontoparietal region. Probable additional punctate lesions more within the left parietal lobe and left frontoparietal region. There is a 4 mm lesion more anteriorly within the left frontal lobe. Faint punctate enhancement within the anterior  right frontal lobe also suspicious for metastatic lesion. 1.2 x 1.6 x 1.6 cm lesion present within the left occipital lobe. 11 mm lesion present within the left cerebellar vermis just to the left of midline. 8 mm lesion present with at the inferomedial left cerebellar hemisphere. The right periatrial and left frontal lobe lesions demonstrate central necrosis with associated restricted diffusion. Chronic blood products seen within several of these lesions on gradient echo sequence. There is mild 3-4 mm of right-to-left midline shift at the level of the septum pellucidum. No hydrocephalus. Basilar cisterns remain patent. Chest x-ray at that time showed right perihilar density suspicious for mass measuring approximately 4.9 x 4.7 cm. The patient had CT scan of the chest with contrast on 10/22/2014 and it showed right upper lobe primary bronchogenic carcinoma measuring 3.9 x 2.9 x 3.1 cm. there was no evidence of thoracic nodal metastasis. There was moderate to marked left adrenal enlargement suspicious for normal red renal metastasis. There was incompletely imaged lucency within C7. This could be degenerative but isolated osseous metastasis cannot be excluded. Unfortunately the patient was not started on Decadron for his brain metastasis until he saw Dr. Melvyn Novas.  On 08/27/2015 the patient underwent a video bronchoscopy under the care of Dr. Melvyn Novas and the final pathology of the right upper lobe endobronchial biopsy (Accession: [id number]) showed invasive poorly differentiated adenocarcinoma. Immunostains were performed and the tumor cells are positive for CK-7, TTF-1 and Napsin-A, negative for CK-20 with appropriate controls. The findings are diagnostic for invasive poorly differentiated adenocarcinoma. Dr. Melvyn Novas kindly referred the patient to me today for evaluation and recommendation regarding treatment of his condition. When seen today the  patient continues to have dizzy spells as well as visual changes. His imbalance  has improved after restarting the Decadron. He is currently on Decadron 4 mg by mouth every 6 hours. He initially lost around 11 pounds but he is gaining it back. He denied having any significant chest pain, shortness breath but has cough with no hemoptysis. He denied having any significant nausea or vomiting. He takes hydrocodone when necessary for the headache. Family history significant for mother with throat cancer and father is healthy. The patient is single and has one son. He was accompanied today by his girlfriend Jason Keller. He works as a Administrator for [employer]. He is not currently driving. He has a history of smoking one half pack per day for around city 4 years and unfortunately continues to smoke around half a pack per day. He also drinks a couple of beer on daily basis and no history of drug abuse.   HPI  Past Medical History  Diagnosis Date  . Vertigo     Past Surgical History  Procedure Laterality Date  . Video bronchoscopy Bilateral 10/26/2014    Procedure: VIDEO BRONCHOSCOPY WITHOUT FLUORO;  Surgeon: Tanda Rockers, MD;  Location: [facility] ENDOSCOPY;  Service: Cardiopulmonary;  Laterality: Bilateral;    History reviewed. No pertinent family history.  Social History History  Substance Use Topics  . Smoking status: Current Every Day Smoker -- 1.00 packs/day for 37 years    Types: Cigarettes  . Smokeless tobacco: Never Used  . Alcohol Use: 0.0 oz/week    0 Not specified per week     Comment: 2 drinks per day    Allergies  Allergen Reactions  . Penicillins Rash    Current Outpatient Prescriptions  Medication Sig Dispense Refill  . dexamethasone (DECADRON) 4 MG tablet Take 1 tablet (4 mg total) by mouth 4 (four) times daily. 60 tablet 0  . famotidine (PEPCID) 20 MG tablet One at bedtime 30 tablet 2  . HYDROcodone-acetaminophen (NORCO) 5-325 MG per tablet Take 1 tablet by mouth every 4 (four) hours as needed for moderate pain. 40 tablet 0  . meclizine (ANTIVERT) 12.5 MG  tablet Take 12.5 mg by mouth 2 (two) times daily as needed for dizziness.    . pantoprazole (PROTONIX) 40 MG tablet Take 1 tablet (40 mg total) by mouth daily. Take 30-60 min before first meal of the day 30 tablet 2   No current facility-administered medications for this visit.    Review of Systems  Constitutional: positive for fatigue and weight loss Eyes: positive for visual disturbance Ears, nose, mouth, throat, and face: negative Respiratory: positive for cough and dyspnea on exertion Cardiovascular: negative Gastrointestinal: negative Genitourinary:negative Integument/breast: negative Hematologic/lymphatic: negative Musculoskeletal:negative Neurological: positive for dizziness, gait problems and memory problems Behavioral/Psych: negative Endocrine: negative Allergic/Immunologic: negative  Physical Exam  ZOX:WRUEA, healthy, no distress, well nourished, well developed and anxious SKIN: skin color, texture, turgor are normal, no rashes or significant lesions HEAD: Normocephalic, No masses, lesions, tenderness or abnormalities EYES: normal, PERRLA EARS: External ears normal, Canals clear OROPHARYNX:no exudate, no erythema and lips, buccal mucosa, and tongue normal  NECK: supple, no adenopathy, no JVD LYMPH:  no palpable lymphadenopathy, no hepatosplenomegaly LUNGS: clear to auscultation , and palpation HEART: regular rate & rhythm, no murmurs and no gallops ABDOMEN:abdomen soft, non-tender, normal bowel sounds and no masses or organomegaly BACK: Back symmetric, no curvature., No CVA tenderness EXTREMITIES:no joint deformities, effusion, or inflammation, no edema  NEURO: alert & oriented x 3 with fluent  speech, no focal motor/sensory deficits  PERFORMANCE STATUS: ECOG 1  LABORATORY DATA: Lab Results  Component Value Date   WBC 6.6 10/08/2014   HGB 14.3 10/08/2014   HCT 41.0 10/08/2014   MCV 89.1 10/08/2014   PLT 208 10/08/2014      Chemistry      Component Value  Date/Time   NA 138 10/08/2014 2004   K 3.9 10/08/2014 2004   CL 106 10/08/2014 2004   CO2 21 10/08/2014 2004   BUN 11 10/08/2014 2004   CREATININE 0.74 10/08/2014 2004      Component Value Date/Time   CALCIUM 9.2 10/08/2014 2004   ALKPHOS 44 10/08/2014 2004   AST 18 10/08/2014 2004   ALT 16 10/08/2014 2004   BILITOT 0.4 10/08/2014 2004       RADIOGRAPHIC STUDIES: Dg Chest 2 View  10/08/2014   CLINICAL DATA:  Headache and dizziness for 6 weeks, decreased memory, feet tracking RIGHT greater than LEFT, smoker, weakness  EXAM: CHEST  2 VIEW  COMPARISON:  None  FINDINGS: Normal heart size, mediastinal contours and pulmonary vascularity.  Emphysematous and minimal bronchitic changes consistent with COPD.  RIGHT perihilar density suspicious for mass, area approximately 4.9 x 4.7 cm.  Remaining lungs clear.  No pleural effusion or pneumothorax.  IMPRESSION: COPD changes with suspicion of a perihilar mass in the RIGHT upper lobe 4.9 x 4.7 cm ; CT chest with contrast recommended for further evaluation.   Electronically Signed   By: Ulyses Southward M.D.   On: 10/08/2014 22:19   Ct Head Wo Contrast  10/08/2014   CLINICAL DATA:  Frontal headache for 3 months, dizziness for 3 months  EXAM: CT HEAD WITHOUT CONTRAST  TECHNIQUE: Contiguous axial images were obtained from the base of the skull through the vertex without intravenous contrast.  COMPARISON:  None.  FINDINGS: Areas of extensive white matter hypodensity are noted bilaterally. There is a possible associated 2.1 cm mass with adjacent white matter edema in the right occipital lobe image 17. There is mild subjective adjacent sulcal effacement. No midline shift. No acute hemorrhage. Evidence of scleral banding noted. No skull fracture. Moderate pansinusitis. No lytic or sclerotic osseous lesion.  IMPRESSION: Multiple bilateral areas of white matter presumed edema with suspicion for underlying mass lesions, statistically most likely metastatic disease. No  acute hemorrhage. Infection or inflammation is less likely but possible. Further evaluation with brain MRI with contrast is recommended.  These results were called by telephone at the time of interpretation on 10/08/2014 at 8:21 pm to Dr. Dierdre Forth , who verbally acknowledged these results.   Electronically Signed   By: Christiana Pellant M.D.   On: 10/08/2014 20:23   Ct Chest W Contrast  10/22/2014   CLINICAL DATA:  Brain lesions on MRI. Right upper lobe lung mass on CT. Two month history of vertigo.  EXAM: CT CHEST WITH CONTRAST  TECHNIQUE: Multidetector CT imaging of the chest was performed during intravenous contrast administration.  CONTRAST:  57mL OMNIPAQUE IOHEXOL 300 MG/ML  SOLN  COMPARISON:  Chest radiograph of 10/08/2014. Brain MRI of 10/08/2014.  FINDINGS: Mediastinum/Nodes: No supraclavicular adenopathy.  Aortic and branch vessel atherosclerosis. Bovine arch. Normal heart size. Trace anterior pericardial fluid or thickening. Likely physiologic. No central pulmonary embolism, on this non-dedicated study. No mediastinal or hilar adenopathy.  Lungs/Pleura: Narrowing of the right upper lobe segmental bronchi secondary to tumor to be described below. Other airways are within normal limits.  Mild to moderate centrilobular emphysema.  3 mm  right apical lung nodule on image 11.  Central and inferior right upper lobe lung mass. Spiculated, measuring 3.9 x 2.9 cm on transverse image 25. 3.1 cm craniocaudal on sagittal image 52. Contacts the right major fissure. Causes mild volume loss in the anterior right upper lobe.  3 mm lingular nodule on image 42.  No pleural fluid.  Upper abdomen: Normal imaged portions of the liver, spleen, stomach, pancreas, kidneys. Minimal right adrenal thickening. Left adrenal gland is enlarged and nodular. Measures 3.2 x 3.5 cm. Maintains its adreniform shape. Incompletely imaged.  Musculoskeletal: Subchondral cyst formation within the right glenoid. Incompletely imaged  lucency in the C7 vertebral body on image 1 may be degenerative. Incompletely imaged.  IMPRESSION: 1. Right upper lobe primary bronchogenic carcinoma. 2. No evidence of thoracic nodal metastasis. 3. Moderate to marked left adrenal enlargement. Although the gland maintains its adreniform shape, this is suspicious for 1 or more adrenal metastasis. 4. Incompletely imaged lucency within C7. This could be degenerative. Isolated osseous metastasis cannot be excluded. 5. Above findings could be further evaluated with PET, if indicated.   Electronically Signed   By: Abigail Miyamoto M.D.   On: 10/22/2014 16:52   Mr Jeri Cos RJ Contrast  10/08/2014   CLINICAL DATA:  Initial evaluation for memory loss with vertigo. Abnormal CT earlier today. History of smoking.  EXAM: MRI HEAD WITHOUT AND WITH CONTRAST  TECHNIQUE: Multiplanar, multiecho pulse sequences of the brain and surrounding structures were obtained without and with intravenous contrast.  CONTRAST:  49mL MULTIHANCE GADOBENATE DIMEGLUMINE 529 MG/ML IV SOLN  COMPARISON:  Prior CT from earlier the same day.  FINDINGS: Multiple abnormal heterogeneously enhancing mass is seen scattered throughout the brain, most consistent with intracranial metastases. These lesions are seen as follows:  Largest lesion located within the periatrial white matter in the right temporal parietal region measuring 3.5 x 1.9 x 2.7 cm (series 12, image 29). There is an adjacent lesion just inferiorly measuring 2.2 x 2.7 x 1.6 cm. There is extensive vasogenic edema about these lesions with partial effacement of the right lateral ventricle.  Lesion within the parasagittal left frontal lobe near the vertex measures 2.1 x 2.1 x 2.8 cm (series 12, image 45). Extensive vasogenic edema present about this lesion as well.  1.9 x 1.9 x 1.8 cm lesion present within the peripheral right frontal lobe towards the vertex (series 12, image 40).  1.4 x 1.9 x 1.3 cm lesion in the right frontoparietal region (series 2,  image 41). Probable additional punctate lesions more within the left parietal lobe (series 12, image 43) and left frontoparietal region (series 12, image 36). There is a 4 mm lesion more anteriorly within the left frontal lobe (series 12, image 31). Faint punctate enhancement within the anterior right frontal lobe on series 12, image 29 also suspicious for metastatic lesion.  1.2 x 1.6 x 1.6 cm lesion present within the left occipital lobe (series 12, image 22).  11 mm lesion present within the left cerebellar vermis just to the left of midline (series 12, image 19).  8 mm lesion present with at the inferomedial left cerebellar hemisphere (series 12, image 10).  The right periatrial and left frontal lobe lesions demonstrate central necrosis with associated restricted diffusion. Chronic blood products seen within several of these lesions on gradient echo sequence.  There is mild 3-4 mm of right-to-left midline shift at the level of the septum pellucidum. No hydrocephalus. Basilar cisterns remain patent.  No acute large vessel territory  infarct.  Craniocervical junction within normal limits. Pituitary gland normal. Visualized upper cervical spine unremarkable.  Bone marrow signal intensity is within normal limits. No definite calvarial or osseous lesions.  No acute abnormality seen about the orbits.  Scattered mucoperiosteal thickening present within the ethmoidal air cells and maxillary sinuses. There is partial opacification with air-fluid level within knee inferior right frontal sinus. Mild opacity present within the left sphenoid sinus. No mastoid effusion.  IMPRESSION: 1. Multiple intracranial metastases with associated vasogenic edema as detailed above. 2. 3-4 mm of right-to-left midline shift.  No hydrocephalus. 3. Paranasal sinus disease as above. Air-fluid level within the right frontal sinus suggest active sinus infection.   Electronically Signed   By: Rise Mu M.D.   On: 10/08/2014 22:18     ASSESSMENT: This is a very pleasant 54 years old white male recently diagnosed with a stage IV (T2a, N0, M1b) non-small cell lung cancer, adenocarcinoma presented with large right upper lobe lung mass in addition to innumerable brain metastasis and questionable adrenal and bone metastasis diagnosed in January 2016.   PLAN: I had a lengthy discussion with the patient and his girlfriend today about his current disease stage, prognosis and treatment options. The patient is scheduled to have repeat MRI of the brain with SRS protocol ordered by Dr. Kathrynn Running before consideration of radiotherapy to the brain. He is expected to see Dr. Kathrynn Running on 11/03/2014 for more detailed discussion of treatment of his brain lesions. I recommended for the patient to continue his current treatment with Decadron 4 mg by mouth every 6 hours for now and this will be tapered gradually by Dr. Kathrynn Running during the radiotherapy. I discussed with the patient has treatment options and explained to him that he has incurable condition and on the treatment will be off palliative nature. I will send the tissue block to be tested for EGFR mutation and gene translocation among other mutations by [facility] one. I will consider starting the patient on systemic chemotherapy with carboplatin for AUC of 5 and Alimta 500 MG/M2 every 3 weeks once he complete the staging workup if he has no targetable biomarker. I discussed with the patient briefly the adverse effect of the chemotherapy including but not limited to mild alopecia, myelosuppression, nausea and vomiting, peripheral neuropathy, liver or renal dysfunction. I will see him back for follow-up visit in less than 2 weeks for further evaluation and more detailed discussion of his treatment options after completion of the radiotherapy. For smoke cessation, I strongly encouraged the patient to quit smoking and offered him to smoke cessation program. He was also seen by the thoracic navigator  for smoke cessation counseling today. The patient was advised to call immediately if he has any concerning symptoms in the interval. The patient voices understanding of current disease status and treatment options and is in agreement with the current care plan.  All questions were answered. The patient knows to call the clinic with any problems, questions or concerns. We can certainly see the patient much sooner if necessary.  Thank you so much for allowing me to participate in the care of Jason Keller. I will continue to follow up the patient with you and assist in his care.  I spent 55 minutes counseling the patient face to face. The total time spent in the appointment was 80 minutes.  Disclaimer: This note was dictated with voice recognition software. Similar sounding words can inadvertently be transcribed and may not be corrected upon review.   Delma Villalva  K. 10/30/2014, 11:04 AM

## 2014-10-30 NOTE — Patient Instructions (Signed)
Smoking Cessation, Tips for Success  If you are ready to quit smoking, congratulations! You have chosen to help yourself be healthier. Cigarettes bring nicotine, tar, carbon monoxide, and other irritants into your body. Your lungs, heart, and blood vessels will be able to work better without these poisons. There are many different ways to quit smoking. Nicotine gum, nicotine patches, a nicotine inhaler, or nicotine nasal spray can help with physical craving. Hypnosis, support groups, and medicines help break the habit of smoking.  WHAT THINGS CAN I DO TO MAKE QUITTING EASIER?   Here are some tips to help you quit for good:  · Pick a date when you will quit smoking completely. Tell all of your friends and family about your plan to quit on that date.  · Do not try to slowly cut down on the number of cigarettes you are smoking. Pick a quit date and quit smoking completely starting on that day.  · Throw away all cigarettes.    · Clean and remove all ashtrays from your home, work, and car.  · On a card, write down your reasons for quitting. Carry the card with you and read it when you get the urge to smoke.  · Cleanse your body of nicotine. Drink enough water and fluids to keep your urine clear or pale yellow. Do this after quitting to flush the nicotine from your body.  · Learn to predict your moods. Do not let a bad situation be your excuse to have a cigarette. Some situations in your life might tempt you into wanting a cigarette.  · Never have "just one" cigarette. It leads to wanting another and another. Remind yourself of your decision to quit.  · Change habits associated with smoking. If you smoked while driving or when feeling stressed, try other activities to replace smoking. Stand up when drinking your coffee. Brush your teeth after eating. Sit in a different chair when you read the paper. Avoid alcohol while trying to quit, and try to drink fewer caffeinated beverages. Alcohol and caffeine may urge you to  smoke.  · Avoid foods and drinks that can trigger a desire to smoke, such as sugary or spicy foods and alcohol.  · Ask people who smoke not to smoke around you.  · Have something planned to do right after eating or having a cup of coffee. For example, plan to take a walk or exercise.  · Try a relaxation exercise to calm you down and decrease your stress. Remember, you may be tense and nervous for the first 2 weeks after you quit, but this will pass.  · Find new activities to keep your hands busy. Play with a pen, coin, or rubber band. Doodle or draw things on paper.  · Brush your teeth right after eating. This will help cut down on the craving for the taste of tobacco after meals. You can also try mouthwash.    · Use oral substitutes in place of cigarettes. Try using lemon drops, carrots, cinnamon sticks, or chewing gum. Keep them handy so they are available when you have the urge to smoke.  · When you have the urge to smoke, try deep breathing.  · Designate your home as a nonsmoking area.  · If you are a heavy smoker, ask your health care provider about a prescription for nicotine chewing gum. It can ease your withdrawal from nicotine.  · Reward yourself. Set aside the cigarette money you save and buy yourself something nice.  · Look for   support from others. Join a support group or smoking cessation program. Ask someone at home or at work to help you with your plan to quit smoking.  · Always ask yourself, "Do I need this cigarette or is this just a reflex?" Tell yourself, "Today, I choose not to smoke," or "I do not want to smoke." You are reminding yourself of your decision to quit.  · Do not replace cigarette smoking with electronic cigarettes (commonly called e-cigarettes). The safety of e-cigarettes is unknown, and some may contain harmful chemicals.  · If you relapse, do not give up! Plan ahead and think about what you will do the next time you get the urge to smoke.  HOW WILL I FEEL WHEN I QUIT SMOKING?  You  may have symptoms of withdrawal because your body is used to nicotine (the addictive substance in cigarettes). You may crave cigarettes, be irritable, feel very hungry, cough often, get headaches, or have difficulty concentrating. The withdrawal symptoms are only temporary. They are strongest when you first quit but will go away within 10-14 days. When withdrawal symptoms occur, stay in control. Think about your reasons for quitting. Remind yourself that these are signs that your body is healing and getting used to being without cigarettes. Remember that withdrawal symptoms are easier to treat than the major diseases that smoking can cause.   Even after the withdrawal is over, expect periodic urges to smoke. However, these cravings are generally short lived and will go away whether you smoke or not. Do not smoke!  WHAT RESOURCES ARE AVAILABLE TO HELP ME QUIT SMOKING?  Your health care provider can direct you to community resources or hospitals for support, which may include:  · Group support.  · Education.  · Hypnosis.  · Therapy.  Document Released: 06/30/2004 Document Revised: 02/16/2014 Document Reviewed: 03/20/2013  [employer]® Patient Information ©2015 [employer]. This information is not intended to replace advice given to you by your health care provider. Make sure you discuss any questions you have with your health care provider.

## 2014-10-30 NOTE — Telephone Encounter (Signed)
Gave avs & cal for Jan.

## 2014-11-02 ENCOUNTER — Ambulatory Visit: Payer: PRIVATE HEALTH INSURANCE | Admitting: Radiation Oncology

## 2014-11-02 ENCOUNTER — Other Ambulatory Visit (HOSPITAL_COMMUNITY)
Admission: RE | Admit: 2014-11-02 | Payer: PRIVATE HEALTH INSURANCE | Source: Ambulatory Visit | Admitting: Internal Medicine

## 2014-11-02 ENCOUNTER — Encounter (HOSPITAL_COMMUNITY): Payer: Self-pay

## 2014-11-02 ENCOUNTER — Ambulatory Visit
Admission: RE | Admit: 2014-11-02 | Discharge: 2014-11-02 | Disposition: A | Discharge status: Home / Self Care | Payer: PRIVATE HEALTH INSURANCE | Source: Ambulatory Visit | Attending: Radiation Oncology | Admitting: Radiation Oncology

## 2014-11-02 ENCOUNTER — Encounter: Payer: Self-pay | Admitting: Radiation Oncology

## 2014-11-02 ENCOUNTER — Other Ambulatory Visit (HOSPITAL_COMMUNITY)
Admission: RE | Admit: 2014-11-02 | Discharge: 2014-11-02 | Disposition: A | Discharge status: Home / Self Care | Payer: PRIVATE HEALTH INSURANCE | Source: Ambulatory Visit | Attending: Internal Medicine | Admitting: Internal Medicine

## 2014-11-02 DIAGNOSIS — C7931 Secondary malignant neoplasm of brain: Secondary | ICD-10-CM

## 2014-11-02 DIAGNOSIS — C3491 Malignant neoplasm of unspecified part of right bronchus or lung: Secondary | ICD-10-CM | POA: Diagnosis present

## 2014-11-02 MED ORDER — GADOBENATE DIMEGLUMINE 529 MG/ML IV SOLN
14.0000 mL | Freq: Once | INTRAVENOUS | Status: AC | PRN
  Administered 2014-11-02: 14 mL via INTRAVENOUS

## 2014-11-02 NOTE — Progress Notes (Signed)
Location/Histology of Brain Tumor: Twelve enhancing brain lesions are as follows: 6 mm medial inferior left cerebellar hemisphere (image 21, previously 8 mm), 6 mm superior left cerebellar vermis (image 46, previously 11 mm), 5 mm medial right occipital lobe (image 59, previously 4 mm), 2.5 x 2.0 cm right temporal-occipital region (image 65, previously 2.6 x 2.1 cm), 2.8 x 1.9 cm right occipital lobe posterior to the atrium (image 81, previously 3.5 x 2.2 cm), 1.5 x 1.1 cm left occipital lobe (image 46, previously 1.6 x 1.2 cm), 1.7 x 1.2 cm right parietal lobe (image 116, previously 1.8 x 1.4 cm), 2 mm inferolateral left parietal lobe (image 91, not well seen on the prior study), 3 mm high left parietal lobe (image 117, previously 3 mm), 1.8 x 1.7 cm right frontal lobe (image 128, previously 1.9 x 1.9 cm), 2.6 x 1.9 cm medial left frontal lobe (image 132, previously 2.4 x 2.1 cm), and 2 mm anterior left frontal lobe (image 90, previously 2 mm).  Patient presented with symptoms of:  Imbalance, memory loss, nonproductive cough and weight loss   Past or anticipated interventions, if any, per neurosurgery: no  Past or anticipated interventions, if any, per medical oncology: Mohamed sent the tissue block to be tested for EGFR mutation and gene translocation among other mutations by [facility] one. Also, he is considering starting the patient on systemic chemotherapy with carboplatin for AUC of 5 and Alimta 500 MG/M2 every 3 weeks once he complete the staging workup if he has no targetable biomarker   Dose of Decadron, if applicable: Decadron 4 mg qid  Recent neurologic symptoms, if any:   Seizures: no  Headaches: yes, frontal  Nausea: no  Dizziness/ataxia: dizziness and imbalanced gait  Difficulty with hand coordination: no  Focal numbness/weakness: no  Visual deficits/changes: no  Confusion/Memory deficits: yes  Painful bone metastases at present, if any: Osseous metastasis  couldn't be excluded at C7  SAFETY ISSUES:  Prior radiation? no  Pacemaker/ICD? no  Possible current pregnancy? no  Is the patient on methotrexate? no  Additional Complaints / other details: 54 year old male. Single with one child. Current everyday smoker.

## 2014-11-03 ENCOUNTER — Encounter: Payer: Self-pay | Admitting: Radiation Oncology

## 2014-11-03 ENCOUNTER — Encounter: Payer: Self-pay | Admitting: *Deleted

## 2014-11-03 ENCOUNTER — Ambulatory Visit
Admission: RE | Admit: 2014-11-03 | Discharge: 2014-11-03 | Disposition: A | Discharge status: Home / Self Care | Payer: PRIVATE HEALTH INSURANCE | Source: Ambulatory Visit | Attending: Radiation Oncology | Admitting: Radiation Oncology

## 2014-11-03 VITALS — BP 156/85 | HR 85 | Temp 98.0°F | Resp 16 | Ht 70.0 in | Wt 161.0 lb

## 2014-11-03 DIAGNOSIS — C7931 Secondary malignant neoplasm of brain: Secondary | ICD-10-CM

## 2014-11-03 DIAGNOSIS — Z79899 Other long term (current) drug therapy: Secondary | ICD-10-CM | POA: Insufficient documentation

## 2014-11-03 DIAGNOSIS — F17210 Nicotine dependence, cigarettes, uncomplicated: Secondary | ICD-10-CM | POA: Insufficient documentation

## 2014-11-03 DIAGNOSIS — Z510 Encounter for antineoplastic radiation therapy: Secondary | ICD-10-CM | POA: Insufficient documentation

## 2014-11-03 DIAGNOSIS — C3411 Malignant neoplasm of upper lobe, right bronchus or lung: Secondary | ICD-10-CM | POA: Insufficient documentation

## 2014-11-03 NOTE — Progress Notes (Signed)
  Radiation Oncology         ([phone]) [phone] ________________________________  Name: Jason Keller MRN: [id number]  Date: 11/03/2014  DOB: 04-18-1961  SIMULATION AND TREATMENT PLANNING NOTE    ICD-9-CM ICD-10-CM   1. 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  Identity was confirmed.  All relevant records and images related to the planned course of therapy were reviewed.  The patient freely provided informed written consent to proceed with treatment after reviewing the details related to the planned course of therapy. The consent form was witnessed and verified by the simulation staff.  Then, the patient was set-up in a stable reproducible  supine position for radiation therapy.  CT images were obtained.  Surface markings were placed.  The CT images were loaded into the planning software.  Then the target and avoidance structures were contoured.  Treatment planning then occurred.  The radiation prescription was entered and confirmed.  Then, I designed and supervised the construction of a total of 3 medically necessary complex treatment devices, including a custom made thermoplastic mask used for immobilization and two complex multileaf collimators to cover the entire intracranial contents, while shielding the eyes and face.  Each [facility] is independently created to account for beam divergence.  The right and left lateral fields will be treated with 6 MV X-rays.  I have requested : Isodose Plan.    PLAN:  The whole brain will be treated to 35 Gy in 14 fractions.  ________________________________  Sheral Apley Tammi Klippel, M.D.    Addendum:  At the time of planning, the patient was noted to have dosimetric hot spots greater than 5%. Accordingly to additional multileaf collimators were constructed under my supervision to cool down  these hot spots by using an infield reduced radiation beam. This brings the total complex treatment devices to 5.

## 2014-11-03 NOTE — Progress Notes (Signed)
See progress note under physician encounter. 

## 2014-11-03 NOTE — Progress Notes (Signed)
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 Brain metastases 198.3 C79.31     HISTORY OF PRESENT ILLNESS::Jason Keller is a 54 y.o. male who was  complaining of imbalance in his gait and memory loss for few weeks. He was seen at [facility] and was referred to ENT for evaluation. He was seen by ENT but was treated for vertigo with no significant improvement. On 10/08/2014, the patient presented to the emergency department at [facility] complaining of the same symptoms including the imbalanced gait, memory loss, nonproductive cough as well as weight loss. During his evaluation CT scan of the head followed by MRI of the brain were performed and it showed Multiple abnormal heterogeneously enhancing mass is seen scattered throughout the brain, most consistent with intracranial metastases.These lesions are seen as follows: Largest lesion located within the periatrial white matter in the right temporal parietal region measuring 3.5 x 1.9 x 2.7 cm. There is an adjacent lesion just inferiorly measuring 2.2 x 2.7 x 1.6 cm. There is extensive vasogenic edema about these lesions with partial effacement of the right lateral ventricle. Lesion within the parasagittal left frontal lobe near the vertex measures 2.1 x 2.1 x 2.8 cm. Extensive vasogenic edema present about this lesion as well. 1.9 x 1.9 x 1.8 cm lesion present within the peripheral right frontal lobe towards the vertex. 1.4 x 1.9 x 1.3 cm lesion in the right frontoparietal region. Probable additional punctate lesions more within the left  parietal lobe and left frontoparietal region. There is a 4 mm lesion more anteriorly within the left frontal lobe. Faint punctate enhancement within the anterior right frontal lobe also suspicious for metastatic lesion. 1.2 x 1.6 x 1.6 cm lesion present within the left occipital lobe. 11 mm lesion present within the left cerebellar vermis just to the left of midline. 8 mm lesion present with at the inferomedial left cerebellar hemisphere. The right periatrial and left frontal lobe lesions demonstrate central necrosis with associated restricted diffusion. Chronic blood products seen within several of these lesions on gradient echo sequence. There is mild 3-4 mm of right-to-left midline shift at the level of the septum pellucidum. No hydrocephalus. Basilar cisterns remain patent. Chest x-ray at that time showed right perihilar density suspicious for mass measuring approximately 4.9 x 4.7 cm. The patient had CT scan of the chest with contrast on 10/22/2014 and it showed right upper lobe primary bronchogenic carcinoma measuring 3.9 x 2.9 x 3.1 cm. there was no evidence of thoracic nodal metastasis. There was moderate to marked left adrenal enlargement suspicious for normal red renal metastasis. There was incompletely imaged lucency within C7. This could be degenerative but isolated osseous metastasis cannot be excluded. Unfortunately the patient was not started on Decadron for his brain metastasis until he saw Dr. Melvyn Novas.  On 10/26/2014 the patient underwent a video bronchoscopy under the care of Dr. Melvyn Novas and the final pathology of the right upper lobe endobronchial biopsy (Accession: [id number]) showed invasive poorly differentiated adenocarcinoma. Immunostains were performed and the tumor cells are positive for CK-7, TTF-1 and Napsin-A, negative for CK-20 with appropriate  controls. The findings are diagnostic for invasive poorly differentiated adenocarcinoma. Dr. Melvyn Novas kindly referred the patient to me today for  evaluation and recommendation regarding treatment of his condition. His imbalance has improved after restarting the Decadron. He is currently on Decadron 4 mg by mouth every 6 hours. He initially lost around 11 pounds but he is gaining it back. He denied having any significant chest pain, shortness breath but has cough with no hemoptysis. He denied having any significant nausea or vomiting. He takes hydrocodone when necessary for the headache.. In anticipation of today's visit, the patient underwent high-resolution 3T brain MRI for the question regarding possible stereotactic radiosurgery versus whole brain irradiation. Unfortunately, the MRI revealed additional brain metastases for a total of 12 lesions. The patient presents today to review the MRI and coordinate possible radiotherapeutic interventions for his brain metastases.   PREVIOUS RADIATION THERAPY: No  PAST MEDICAL HISTORY:  has a past medical history of Vertigo; Lung cancer; and Brain cancer.    PAST SURGICAL HISTORY: Past Surgical History  Procedure Laterality Date  . Video bronchoscopy Bilateral 10/26/2014    Procedure: VIDEO BRONCHOSCOPY WITHOUT FLUORO;  Surgeon: Tanda Rockers, MD;  Location: [facility];  Service: Cardiopulmonary;  Laterality: Bilateral;    FAMILY HISTORY: family history includes Cancer in his paternal grandmother.  SOCIAL HISTORY:  reports that he has been smoking Cigarettes.  He has a 37 pack-year smoking history. He has never used smokeless tobacco. He reports that he drinks alcohol. He reports that he does not use illicit drugs.  ALLERGIES: Penicillins  MEDICATIONS:  Current Outpatient Prescriptions  Medication Sig Dispense Refill  . dexamethasone (DECADRON) 4 MG tablet Take 1 tablet (4 mg total) by mouth 4 (four) times daily. 60 tablet 0  . famotidine (PEPCID) 20 MG tablet One at bedtime 30 tablet 2  . HYDROcodone-acetaminophen (NORCO) 5-325 MG per tablet Take 1 tablet by mouth every 4 (four) hours as  needed for moderate pain. 40 tablet 0  . meclizine (ANTIVERT) 12.5 MG tablet Take 12.5 mg by mouth 2 (two) times daily as needed for dizziness.    . pantoprazole (PROTONIX) 40 MG tablet Take 1 tablet (40 mg total) by mouth daily. Take 30-60 min before first meal of the day 30 tablet 2   No current facility-administered medications for this encounter.    REVIEW OF SYSTEMS:  A 15 point review of systems is documented in the electronic medical record. This was obtained by the nursing staff. However, I reviewed this with the patient to discuss relevant findings and make appropriate changes.  Pertinent items are noted in HPI.   PHYSICAL EXAM:  height is 5\' 10"  (1.778 m) and weight is 161 lb (73.029 kg). His oral temperature is 98 F (36.7 C). His blood pressure is 156/85 and his pulse is 85. His respiration is 16 and oxygen saturation is 100%.   Per medical oncology  ZSW:FUXNA, healthy, no distress, well nourished, well developed and anxious SKIN: skin color, texture, turgor are normal, no rashes or significant lesions HEAD: Normocephalic, No masses, lesions, tenderness or abnormalities EYES: normal, PERRLA EARS: External ears normal, Canals clear OROPHARYNX:no exudate, no erythema and lips, buccal mucosa, and tongue normal  NECK: supple, no adenopathy, no JVD LYMPH: no palpable lymphadenopathy, no hepatosplenomegaly LUNGS: clear to auscultation , and palpation HEART: regular rate & rhythm, no murmurs and no gallops ABDOMEN:abdomen soft, non-tender, normal bowel sounds and no masses or organomegaly BACK: Back symmetric, no curvature., No CVA tenderness EXTREMITIES:no joint deformities,  effusion, or inflammation, no edema  NEURO: alert & oriented x 3 with fluent speech, no focal motor/sensory deficits On my inspection today, I concur with the findings above.   KPS = 90  100 - Normal; no complaints; no evidence of disease. 90   - Able to carry on normal activity; minor signs or symptoms of  disease. 80   - Normal activity with effort; some signs or symptoms of disease. 80   - Cares for self; unable to carry on normal activity or to do active work. 60   - Requires occasional assistance, but is able to care for most of his personal needs. 50   - Requires considerable assistance and frequent medical care. 26   - Disabled; requires special care and assistance. 26   - Severely disabled; hospital admission is indicated although death not imminent. 74   - Very sick; hospital admission necessary; active supportive treatment necessary. 10   - Moribund; fatal processes progressing rapidly. 0     - Dead  Karnofsky DA, Abelmann Cornland, Craver LS and Burchenal [facility] [phone]) The use of the nitrogen mustards in the palliative treatment of carcinoma: with particular reference to bronchogenic carcinoma Cancer 1 [id number]  LABORATORY DATA:  Lab Results  Component Value Date   WBC 6.6 10/08/2014   HGB 14.3 10/08/2014   HCT 41.0 10/08/2014   MCV 89.1 10/08/2014   PLT 208 10/08/2014   Lab Results  Component Value Date   NA 138 10/08/2014   K 3.9 10/08/2014   CL 106 10/08/2014   CO2 21 10/08/2014   Lab Results  Component Value Date   ALT 16 10/08/2014   AST 18 10/08/2014   ALKPHOS 44 10/08/2014   BILITOT 0.4 10/08/2014     RADIOGRAPHY: Dg Chest 2 View  10/08/2014   CLINICAL DATA:  Headache and dizziness for 6 weeks, decreased memory, feet tracking RIGHT greater than LEFT, smoker, weakness  EXAM: CHEST  2 VIEW  COMPARISON:  None  FINDINGS: Normal heart size, mediastinal contours and pulmonary vascularity.  Emphysematous and minimal bronchitic changes consistent with COPD.  RIGHT perihilar density suspicious for mass, area approximately 4.9 x 4.7 cm.  Remaining lungs clear.  No pleural effusion or pneumothorax.  IMPRESSION: COPD changes with suspicion of a perihilar mass in the RIGHT upper lobe 4.9 x 4.7 cm ; CT chest with contrast recommended for further evaluation.   Electronically Signed   By:  Lavonia Dana M.D.   On: 10/08/2014 22:19   Ct Head Wo Contrast  10/08/2014   CLINICAL DATA:  Frontal headache for 3 months, dizziness for 3 months  EXAM: CT HEAD WITHOUT CONTRAST  TECHNIQUE: Contiguous axial images were obtained from the base of the skull through the vertex without intravenous contrast.  COMPARISON:  None.  FINDINGS: Areas of extensive white matter hypodensity are noted bilaterally. There is a possible associated 2.1 cm mass with adjacent white matter edema in the right occipital lobe image 17. There is mild subjective adjacent sulcal effacement. No midline shift. No acute hemorrhage. Evidence of scleral banding noted. No skull fracture. Moderate pansinusitis. No lytic or sclerotic osseous lesion.  IMPRESSION: Multiple bilateral areas of white matter presumed edema with suspicion for underlying mass lesions, statistically most likely metastatic disease. No acute hemorrhage. Infection or inflammation is less likely but possible. Further evaluation with brain MRI with contrast is recommended.  These results were called by telephone at the time of interpretation on 10/08/2014 at 8:21 pm to Dr. Abigail Butts ,  who verbally acknowledged these results.   Electronically Signed   By: Conchita Paris M.D.   On: 10/08/2014 20:23   Ct Chest W Contrast  10/22/2014   CLINICAL DATA:  Brain lesions on MRI. Right upper lobe lung mass on CT. Two month history of vertigo.  EXAM: CT CHEST WITH CONTRAST  TECHNIQUE: Multidetector CT imaging of the chest was performed during intravenous contrast administration.  CONTRAST:  70mL OMNIPAQUE IOHEXOL 300 MG/ML  SOLN  COMPARISON:  Chest radiograph of 10/08/2014. Brain MRI of 10/08/2014.  FINDINGS: Mediastinum/Nodes: No supraclavicular adenopathy.  Aortic and branch vessel atherosclerosis. Bovine arch. Normal heart size. Trace anterior pericardial fluid or thickening. Likely physiologic. No central pulmonary embolism, on this non-dedicated study. No mediastinal or  hilar adenopathy.  Lungs/Pleura: Narrowing of the right upper lobe segmental bronchi secondary to tumor to be described below. Other airways are within normal limits.  Mild to moderate centrilobular emphysema.  3 mm right apical lung nodule on image 11.  Central and inferior right upper lobe lung mass. Spiculated, measuring 3.9 x 2.9 cm on transverse image 25. 3.1 cm craniocaudal on sagittal image 52. Contacts the right major fissure. Causes mild volume loss in the anterior right upper lobe.  3 mm lingular nodule on image 42.  No pleural fluid.  Upper abdomen: Normal imaged portions of the liver, spleen, stomach, pancreas, kidneys. Minimal right adrenal thickening. Left adrenal gland is enlarged and nodular. Measures 3.2 x 3.5 cm. Maintains its adreniform shape. Incompletely imaged.  Musculoskeletal: Subchondral cyst formation within the right glenoid. Incompletely imaged lucency in the C7 vertebral body on image 1 may be degenerative. Incompletely imaged.  IMPRESSION: 1. Right upper lobe primary bronchogenic carcinoma. 2. No evidence of thoracic nodal metastasis. 3. Moderate to marked left adrenal enlargement. Although the gland maintains its adreniform shape, this is suspicious for 1 or more adrenal metastasis. 4. Incompletely imaged lucency within C7. This could be degenerative. Isolated osseous metastasis cannot be excluded. 5. Above findings could be further evaluated with PET, if indicated.   Electronically Signed   By: Abigail Miyamoto M.D.   On: 10/22/2014 16:52   Mr Jeri Cos BM Contrast  11/02/2014   CLINICAL DATA:  Right upper lobe lung cancer with brain metastases. SRS targeting.  EXAM: MRI HEAD WITHOUT AND WITH CONTRAST  TECHNIQUE: Multiplanar, multiecho pulse sequences of the brain and surrounding structures were obtained without and with intravenous contrast.  CONTRAST:  58mL MULTIHANCE GADOBENATE DIMEGLUMINE 529 MG/ML IV SOLN  COMPARISON:  10/08/2014  FINDINGS: There is no evidence of acute large  territory infarct. There is a punctate, 2 mm focus of cortical restricted diffusion in the posterior left frontal lobe (series 4, image 42) which could reflect a tiny, acute small vessel infarct or nonenhancing metastasis. Many of the brain masses demonstrate some degree of restricted diffusion as well as susceptibility artifact consistent with chronic blood products. There is mild to moderate vasogenic edema associated with multiple lesions, overall decreased from the prior study and greatest in the left frontal lobe.  Enhancing brain lesions are as follows: 6 mm medial inferior left cerebellar hemisphere (image 21, previously 8 mm), 6 mm superior left cerebellar vermis (image 46, previously 11 mm), 5 mm medial right occipital lobe (image 59, previously 4 mm), 2.5 x 2.0 cm right temporal-occipital region (image 65, previously 2.6 x 2.1 cm), 2.8 x 1.9 cm right occipital lobe posterior to the atrium (image 81, previously 3.5 x 2.2 cm), 1.5 x 1.1 cm left occipital lobe (  image 46, previously 1.6 x 1.2 cm), 1.7 x 1.2 cm right parietal lobe (image 116, previously 1.8 x 1.4 cm), 2 mm inferolateral left parietal lobe (image 91, not well seen on the prior study), 3 mm high left parietal lobe (image 117, previously 3 mm), 1.8 x 1.7 cm right frontal lobe (image 128, previously 1.9 x 1.9 cm), 2.6 x 1.9 cm medial left frontal lobe (image 132, previously 2.4 x 2.1 cm), and 2 mm anterior left frontal lobe (image 90, previously 2 mm).  Additional punctate foci of enhancement questioned in the left parietal lobe on series 12, image 36 of the prior study and in the right frontal lobe on series 12, image 29 of the prior study are not identified on the current examination and were likely artifactual. There is no significant midline shift. There is no extra-axial fluid collection. There is mild generalized cerebral atrophy.  Orbits are unremarkable. Scattered, mild paranasal sinus mucosal thickening has decreased from the prior study.  Mastoid air cells are clear. Major intracranial vascular flow voids are preserved.  IMPRESSION: 1. At least 12 brain metastases as above, overall stable to slightly smaller than on the prior MRI. Decreased vasogenic edema. 2. Punctate small vessel infarct versus nonenhancing metastasis in the posterior left frontal lobe.   Electronically Signed   By: Logan Bores   On: 11/02/2014 11:42   Mr Brain W Wo Contrast  10/08/2014   CLINICAL DATA:  Initial evaluation for memory loss with vertigo. Abnormal CT earlier today. History of smoking.  EXAM: MRI HEAD WITHOUT AND WITH CONTRAST  TECHNIQUE: Multiplanar, multiecho pulse sequences of the brain and surrounding structures were obtained without and with intravenous contrast.  CONTRAST:  33mL MULTIHANCE GADOBENATE DIMEGLUMINE 529 MG/ML IV SOLN  COMPARISON:  Prior CT from earlier the same day.  FINDINGS: Multiple abnormal heterogeneously enhancing mass is seen scattered throughout the brain, most consistent with intracranial metastases. These lesions are seen as follows:  Largest lesion located within the periatrial white matter in the right temporal parietal region measuring 3.5 x 1.9 x 2.7 cm (series 12, image 29). There is an adjacent lesion just inferiorly measuring 2.2 x 2.7 x 1.6 cm. There is extensive vasogenic edema about these lesions with partial effacement of the right lateral ventricle.  Lesion within the parasagittal left frontal lobe near the vertex measures 2.1 x 2.1 x 2.8 cm (series 12, image 45). Extensive vasogenic edema present about this lesion as well.  1.9 x 1.9 x 1.8 cm lesion present within the peripheral right frontal lobe towards the vertex (series 12, image 40).  1.4 x 1.9 x 1.3 cm lesion in the right frontoparietal region (series 2, image 41). Probable additional punctate lesions more within the left parietal lobe (series 12, image 43) and left frontoparietal region (series 12, image 36). There is a 4 mm lesion more anteriorly within the left  frontal lobe (series 12, image 31). Faint punctate enhancement within the anterior right frontal lobe on series 12, image 29 also suspicious for metastatic lesion.  1.2 x 1.6 x 1.6 cm lesion present within the left occipital lobe (series 12, image 22).  11 mm lesion present within the left cerebellar vermis just to the left of midline (series 12, image 19).  8 mm lesion present with at the inferomedial left cerebellar hemisphere (series 12, image 10).  The right periatrial and left frontal lobe lesions demonstrate central necrosis with associated restricted diffusion. Chronic blood products seen within several of these lesions on gradient  echo sequence.  There is mild 3-4 mm of right-to-left midline shift at the level of the septum pellucidum. No hydrocephalus. Basilar cisterns remain patent.  No acute large vessel territory infarct.  Craniocervical junction within normal limits. Pituitary gland normal. Visualized upper cervical spine unremarkable.  Bone marrow signal intensity is within normal limits. No definite calvarial or osseous lesions.  No acute abnormality seen about the orbits.  Scattered mucoperiosteal thickening present within the ethmoidal air cells and maxillary sinuses. There is partial opacification with air-fluid level within knee inferior right frontal sinus. Mild opacity present within the left sphenoid sinus. No mastoid effusion.  IMPRESSION: 1. Multiple intracranial metastases with associated vasogenic edema as detailed above. 2. 3-4 mm of right-to-left midline shift.  No hydrocephalus. 3. Paranasal sinus disease as above. Air-fluid level within the right frontal sinus suggest active sinus infection.   Electronically Signed   By: Jeannine Boga M.D.   On: 10/08/2014 22:18      IMPRESSION: This patient is a very nice 54 year old gentleman with newly diagnosed adenocarcinoma the right upper lung with 12 brain metastases. The patient would benefit from radiotherapy to the brain delivered  with palliative intent in order to prolong the length of his life and maintain optimal brain function.  Given the large number of metastatic lesions, I would recommend whole brain radiotherapy rather than stereotactic radiosurgery.  PLAN:Today, I talked to the patient and family about the findings and work-up thus far.  We discussed the natural history of brain metastases and general treatment, highlighting the role of radiotherapy in the management.  We discussed the available radiation techniques, and focused on the details of logistics and delivery.  We reviewed the anticipated acute and late sequelae associated with radiation in this setting.  The patient was encouraged to ask questions that I answered to the best of my ability.  I filled out a patient counseling form during our discussion including treatment diagrams.  We retained a copy for our records.  The patient would like to proceed with radiation and will be scheduled for CT simulation.  I spent 60 minutes minutes face to face with the patient and more than 50% of that time was spent in counseling and/or coordination of care.    ------------------------------------------------  Sheral Apley. Tammi Klippel, M.D.

## 2014-11-04 ENCOUNTER — Ambulatory Visit
Admission: RE | Admit: 2014-11-04 | Discharge: 2014-11-04 | Disposition: A | Discharge status: Home / Self Care | Payer: PRIVATE HEALTH INSURANCE | Source: Ambulatory Visit | Attending: Radiation Oncology | Admitting: Radiation Oncology

## 2014-11-04 ENCOUNTER — Encounter (HOSPITAL_COMMUNITY): Payer: Self-pay

## 2014-11-04 DIAGNOSIS — Z510 Encounter for antineoplastic radiation therapy: Secondary | ICD-10-CM | POA: Diagnosis not present

## 2014-11-04 NOTE — Progress Notes (Signed)
[place] Work  Clinical Social Work was referred by [employer] officer for assessment of psychosocial needs, specifically applying for Social Security Disability.  Clinical Social Worker met with patient and patient's mother after simulation appointment.  Mr. Glatfelter shared he is currently under disability through his employer, he is unsure if he has short term disability AND long term disability through this employer. CSW explained process of applying for SSDI- patient and CSW will follow up after PET scan and follow up appointment with Dr. Julien Nordmann.  CSW also provided patient with application for [facility] transportation grant.   Polo Riley, MSW, LCSW, OSW-C Clinical Social Worker [facility] [phone]

## 2014-11-05 ENCOUNTER — Ambulatory Visit
Admission: RE | Admit: 2014-11-05 | Discharge: 2014-11-05 | Disposition: A | Discharge status: Home / Self Care | Payer: PRIVATE HEALTH INSURANCE | Source: Ambulatory Visit | Attending: Radiation Oncology | Admitting: Radiation Oncology

## 2014-11-05 ENCOUNTER — Telehealth: Payer: Self-pay | Admitting: Radiation Oncology

## 2014-11-05 ENCOUNTER — Encounter: Payer: Self-pay | Admitting: Radiation Oncology

## 2014-11-05 VITALS — BP 127/78 | HR 97 | Resp 16 | Wt 156.7 lb

## 2014-11-05 DIAGNOSIS — C7931 Secondary malignant neoplasm of brain: Secondary | ICD-10-CM

## 2014-11-05 DIAGNOSIS — Z510 Encounter for antineoplastic radiation therapy: Secondary | ICD-10-CM | POA: Diagnosis not present

## 2014-11-05 NOTE — Progress Notes (Signed)
  Radiation Oncology         [phone] ________________________________  Name: Jason Keller MRN: [id number]  Date: 11/05/2014  DOB: [birth year]  Weekly Radiation Therapy Management  No diagnosis found.  Current Dose: 5 Gy     Planned Dose:  35 Gy  Narrative . . . . . . . . The patient presents for routine under treatment assessment.                                   Taking decadron 4 mg qid. Reports difficulty sleeping and increased appetite but, understands this is related to effects of steroids. Reports a mild headache following treatment that resolved with pain medication. Denies nausea, vomiting, dizziness, diplopia or ringing in the ears.                                 Set-up films were reviewed.                                 The chart was checked. Physical Findings. . .  weight is 156 lb 11.2 oz (71.079 kg). His blood pressure is 127/78 and his pulse is 97. His respiration is 16 and oxygen saturation is 100%. . No Thrush.  Weight essentially stable.  No significant changes. Impression . . . . . . . The patient is tolerating radiation. Plan . . . . . . . . . . . . Continue treatment as planned.  ________________________________  Sheral Apley. Tammi Klippel, M.D.

## 2014-11-05 NOTE — Telephone Encounter (Signed)
Opened in error

## 2014-11-05 NOTE — Progress Notes (Signed)
Taking decadron 4 mg qid. Reports difficulty sleeping and increased appetite but, understands this is related to effects of steroids. Reports a mild headache following treatment that resolved with pain medication. Denies nausea, vomiting, dizziness, diplopia or ringing in the ears.

## 2014-11-06 ENCOUNTER — Ambulatory Visit: Payer: PRIVATE HEALTH INSURANCE

## 2014-11-09 ENCOUNTER — Ambulatory Visit (HOSPITAL_COMMUNITY)
Admission: RE | Admit: 2014-11-09 | Discharge: 2014-11-09 | Disposition: A | Discharge status: Home / Self Care | Payer: PRIVATE HEALTH INSURANCE | Source: Ambulatory Visit | Attending: Internal Medicine | Admitting: Internal Medicine

## 2014-11-09 ENCOUNTER — Ambulatory Visit
Admission: RE | Admit: 2014-11-09 | Discharge: 2014-11-09 | Disposition: A | Discharge status: Home / Self Care | Payer: PRIVATE HEALTH INSURANCE | Source: Ambulatory Visit | Attending: Radiation Oncology | Admitting: Radiation Oncology

## 2014-11-09 ENCOUNTER — Ambulatory Visit: Payer: PRIVATE HEALTH INSURANCE

## 2014-11-09 DIAGNOSIS — C3491 Malignant neoplasm of unspecified part of right bronchus or lung: Secondary | ICD-10-CM | POA: Diagnosis present

## 2014-11-09 DIAGNOSIS — Z510 Encounter for antineoplastic radiation therapy: Secondary | ICD-10-CM | POA: Diagnosis not present

## 2014-11-09 DIAGNOSIS — C7989 Secondary malignant neoplasm of other specified sites: Secondary | ICD-10-CM | POA: Insufficient documentation

## 2014-11-09 DIAGNOSIS — C7931 Secondary malignant neoplasm of brain: Secondary | ICD-10-CM | POA: Diagnosis not present

## 2014-11-09 DIAGNOSIS — C7972 Secondary malignant neoplasm of left adrenal gland: Secondary | ICD-10-CM | POA: Insufficient documentation

## 2014-11-09 LAB — GLUCOSE, CAPILLARY: GLUCOSE-CAPILLARY: 86 mg/dL (ref 70–99)

## 2014-11-09 MED ORDER — BIAFINE EX EMUL
Freq: Every day | CUTANEOUS | Status: AC
  Administered 2014-11-09: 09:00:00 via TOPICAL

## 2014-11-09 MED ORDER — FLUDEOXYGLUCOSE F - 18 (FDG) INJECTION
8.4000 | Freq: Once | INTRAVENOUS | Status: AC | PRN
  Administered 2014-11-09: 8.4 via INTRAVENOUS

## 2014-11-09 NOTE — Progress Notes (Signed)
Late entry from 11/05/2014. Oriented patient to staff and routine of the clinic. Provided patient with RADIATION THERAPY AND YOU handbook then, reviewed pertinent information. Educated patient reference potential side effects and management such as, fatigue, skin changes, and headache. Provided patient with Biafine cream then, directed upon use. Allowed patient opportunity to ask questions and answered those to the best of my ability. Patient verbalized understanding of all reviewed.

## 2014-11-09 NOTE — Addendum Note (Signed)
Encounter addended by: Heywood Footman, RN on: 11/09/2014  9:12 AM<BR>     Documentation filed: Notes Section, Orders, Dx Association, Inpatient Document Flowsheet, Inpatient Patient Education, Inpatient [facility]

## 2014-11-10 ENCOUNTER — Ambulatory Visit
Admission: RE | Admit: 2014-11-10 | Discharge: 2014-11-10 | Disposition: A | Discharge status: Home / Self Care | Payer: PRIVATE HEALTH INSURANCE | Source: Ambulatory Visit | Attending: Radiation Oncology | Admitting: Radiation Oncology

## 2014-11-10 DIAGNOSIS — Z510 Encounter for antineoplastic radiation therapy: Secondary | ICD-10-CM | POA: Diagnosis not present

## 2014-11-11 ENCOUNTER — Ambulatory Visit (HOSPITAL_BASED_OUTPATIENT_CLINIC_OR_DEPARTMENT_OTHER): Payer: PRIVATE HEALTH INSURANCE | Admitting: Internal Medicine

## 2014-11-11 ENCOUNTER — Other Ambulatory Visit: Payer: Self-pay | Admitting: Medical Oncology

## 2014-11-11 ENCOUNTER — Encounter: Payer: Self-pay | Admitting: Internal Medicine

## 2014-11-11 ENCOUNTER — Ambulatory Visit
Admission: RE | Admit: 2014-11-11 | Discharge: 2014-11-11 | Disposition: A | Discharge status: Home / Self Care | Payer: PRIVATE HEALTH INSURANCE | Source: Ambulatory Visit | Attending: Radiation Oncology | Admitting: Radiation Oncology

## 2014-11-11 ENCOUNTER — Telehealth: Payer: Self-pay | Admitting: Internal Medicine

## 2014-11-11 ENCOUNTER — Telehealth: Payer: Self-pay | Admitting: *Deleted

## 2014-11-11 ENCOUNTER — Other Ambulatory Visit (HOSPITAL_BASED_OUTPATIENT_CLINIC_OR_DEPARTMENT_OTHER): Payer: PRIVATE HEALTH INSURANCE

## 2014-11-11 VITALS — BP 135/83 | HR 74 | Temp 98.7°F | Resp 18 | Ht 70.0 in | Wt 158.6 lb

## 2014-11-11 DIAGNOSIS — C3491 Malignant neoplasm of unspecified part of right bronchus or lung: Secondary | ICD-10-CM

## 2014-11-11 DIAGNOSIS — Z510 Encounter for antineoplastic radiation therapy: Secondary | ICD-10-CM | POA: Diagnosis not present

## 2014-11-11 DIAGNOSIS — R918 Other nonspecific abnormal finding of lung field: Secondary | ICD-10-CM

## 2014-11-11 DIAGNOSIS — C7931 Secondary malignant neoplasm of brain: Secondary | ICD-10-CM

## 2014-11-11 DIAGNOSIS — C3411 Malignant neoplasm of upper lobe, right bronchus or lung: Secondary | ICD-10-CM

## 2014-11-11 LAB — CBC WITH DIFFERENTIAL/PLATELET
BASO%: 0.3 % (ref 0.0–2.0)
Basophils Absolute: 0 10*3/uL (ref 0.0–0.1)
EOS ABS: 0 10*3/uL (ref 0.0–0.5)
EOS%: 0 % (ref 0.0–7.0)
HEMATOCRIT: 45.9 % (ref 38.4–49.9)
HEMOGLOBIN: 14.9 g/dL (ref 13.0–17.1)
LYMPH#: 0.4 10*3/uL — AB (ref 0.9–3.3)
LYMPH%: 3.5 % — ABNORMAL LOW (ref 14.0–49.0)
MCH: 30.7 pg (ref 27.2–33.4)
MCHC: 32.5 g/dL (ref 32.0–36.0)
MCV: 94.5 fL (ref 79.3–98.0)
MONO#: 0.7 10*3/uL (ref 0.1–0.9)
MONO%: 6.3 % (ref 0.0–14.0)
NEUT%: 89.9 % — AB (ref 39.0–75.0)
NEUTROS ABS: 9.7 10*3/uL — AB (ref 1.5–6.5)
PLATELETS: 202 10*3/uL (ref 140–400)
RBC: 4.86 10*6/uL (ref 4.20–5.82)
RDW: 14.4 % (ref 11.0–14.6)
WBC: 10.8 10*3/uL — AB (ref 4.0–10.3)

## 2014-11-11 LAB — COMPREHENSIVE METABOLIC PANEL (CC13)
ALBUMIN: 3.3 g/dL — AB (ref 3.5–5.0)
ALT: 41 U/L (ref 0–55)
AST: 11 U/L (ref 5–34)
Alkaline Phosphatase: 55 U/L (ref 40–150)
Anion Gap: 10 mEq/L (ref 3–11)
BILIRUBIN TOTAL: 0.38 mg/dL (ref 0.20–1.20)
BUN: 17.8 mg/dL (ref 7.0–26.0)
CO2: 27 meq/L (ref 22–29)
CREATININE: 0.7 mg/dL (ref 0.7–1.3)
Calcium: 8.7 mg/dL (ref 8.4–10.4)
Chloride: 102 mEq/L (ref 98–109)
EGFR: >90 mL/min/{1.73_m2} (ref >90)
Glucose: 91 mg/dl (ref 70–140)
Potassium: 4.3 mEq/L (ref 3.5–5.1)
Sodium: 138 mEq/L (ref 136–145)
Total Protein: 6.3 g/dL — ABNORMAL LOW (ref 6.4–8.3)

## 2014-11-11 MED ORDER — CYANOCOBALAMIN 1000 MCG/ML IJ SOLN
1000.0000 ug | Freq: Once | INTRAMUSCULAR | Status: AC
  Administered 2014-11-11: 1000 ug via INTRAMUSCULAR

## 2014-11-11 MED ORDER — CYANOCOBALAMIN 1000 MCG/ML IJ SOLN
INTRAMUSCULAR | Status: AC
  Filled 2014-11-11: qty 1

## 2014-11-11 MED ORDER — FOLIC ACID 1 MG PO TABS: 1.0000 mg | ORAL_TABLET | Freq: Every day | ORAL | Status: DC

## 2014-11-11 MED ORDER — DEXAMETHASONE 4 MG PO TABS: 4.0000 mg | ORAL_TABLET | Freq: Four times a day (QID) | ORAL | Status: DC

## 2014-11-11 MED ORDER — PROCHLORPERAZINE MALEATE 10 MG PO TABS: 10.0000 mg | ORAL_TABLET | Freq: Four times a day (QID) | ORAL | Status: DC | PRN

## 2014-11-11 MED ORDER — HYDROCODONE-ACETAMINOPHEN 5-325 MG PO TABS: 1.0000 | ORAL_TABLET | ORAL | Status: DC | PRN

## 2014-11-11 NOTE — Progress Notes (Signed)
Rx given to pt.

## 2014-11-11 NOTE — Progress Notes (Signed)
[facility] Telephone:(336) [phone]   Fax:(336) [phone]  OFFICE PROGRESS NOTE  No PCP Per Patient No address on file  DIAGNOSIS: Stage IV (T2a, N0, M1b) non-small cell lung cancer, adenocarcinoma, with negative EGFR mutation and negative ALK gene translocation presented with large right upper lobe lung mass in addition to innumerable brain metastasis and questionable adrenal and bone metastasis diagnosed in January 2016.  PRIOR THERAPY: None  CURRENT THERAPY: Whole brain irradiation under the care of Dr. Tammi Klippel.  INTERVAL HISTORY: Jason Keller 54 y.o. male returns to the clinic today for follow-up visit accompanied by his wife and mother. The patient is currently undergoing whole brain irradiation under the care of Dr. Tammi Klippel and he has 8 more fractions to complete this course. He is tolerating his brain radiation fairly well. He is currently on Decadron 4 mg by mouth every 6 hours. The patient denied having any significant chest pain, shortness breath, cough or hemoptysis. He denied having any significant weight loss or night sweats. He has no nausea or vomiting, no fever or chills. He has a PET scan performed recently and he is here for evaluation and discussion of his scan results and treatment options.  MEDICAL HISTORY: Past Medical History  Diagnosis Date  . Vertigo   . 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 Brain cancer     Invasive poorly differentiated adenocarcinoma RUL with 12 brain mets    ALLERGIES:  is allergic to penicillins.  MEDICATIONS:  Current Outpatient Prescriptions  Medication Sig Dispense Refill  . dexamethasone (DECADRON) 4 MG tablet Take 1 tablet (4 mg total) by mouth 4 (four) times daily. 60 tablet 0  . famotidine (PEPCID) 20 MG tablet One at bedtime 30 tablet 2  . meclizine (ANTIVERT) 12.5 MG tablet Take 12.5 mg by mouth 2 (two) times daily as needed for dizziness.    .  pantoprazole (PROTONIX) 40 MG tablet Take 1 tablet (40 mg total) by mouth daily. Take 30-60 min before first meal of the day 30 tablet 2  . emollient (BIAFINE) cream Apply topically as needed.    Marland Kitchen HYDROcodone-acetaminophen (NORCO) 5-325 MG per tablet Take 1 tablet by mouth every 4 (four) hours as needed for moderate pain. 40 tablet 0   No current facility-administered medications for this visit.   Facility-Administered Medications Ordered in Other Visits  Medication Dose Route Frequency Provider Last Rate Last Dose  . topical emolient (BIAFINE) emulsion   Topical Daily [name], MD        SURGICAL HISTORY:  Past Surgical History  Procedure Laterality Date  . Video bronchoscopy Bilateral 10/26/2014    Procedure: VIDEO BRONCHOSCOPY WITHOUT FLUORO;  Surgeon: Tanda Rockers, MD;  Location: [facility];  Service: Cardiopulmonary;  Laterality: Bilateral;    REVIEW OF SYSTEMS:  Constitutional: negative Eyes: negative Ears, nose, mouth, throat, and face: negative Respiratory: negative Cardiovascular: negative Gastrointestinal: negative Genitourinary:negative Integument/breast: negative Hematologic/lymphatic: negative Musculoskeletal:negative Neurological: negative Behavioral/Psych: negative Endocrine: negative Allergic/Immunologic: negative   PHYSICAL EXAMINATION: General appearance: alert, cooperative and no distress Head: Normocephalic, without obvious abnormality, atraumatic Neck: no adenopathy, no JVD, supple, symmetrical, trachea midline and thyroid not enlarged, symmetric, no tenderness/mass/nodules Lymph nodes: Cervical, supraclavicular, and axillary nodes normal. Resp: clear to auscultation bilaterally Back: symmetric, no curvature. ROM normal. No CVA tenderness. Cardio: regular rate and rhythm, S1, S2 normal, no murmur, click, rub or gallop GI: soft, non-tender; bowel sounds normal;  no masses,  no organomegaly Extremities: extremities normal, atraumatic, no  cyanosis or edema Neurologic: Alert and oriented X 3, normal strength and tone. Normal symmetric reflexes. Normal coordination and gait  ECOG PERFORMANCE STATUS: 1 - Symptomatic but completely ambulatory  Blood pressure 135/83, pulse 74, temperature 98.7 F (37.1 C), temperature source Oral, resp. rate 18, height _0  (1.778 m), weight 158 lb 9.6 oz (71.94 kg), SpO2 100 %.  LABORATORY DATA: Lab Results  Component Value Date   WBC 10.8* 11/11/2014   HGB 14.9 11/11/2014   HCT 45.9 11/11/2014   MCV 94.5 11/11/2014   PLT 202 11/11/2014      Chemistry      Component Value Date/Time   NA 138 11/11/2014 1110   NA 138 10/08/2014 2004   K 4.3 11/11/2014 1110   K 3.9 10/08/2014 2004   CL 106 10/08/2014 2004   CO2 27 11/11/2014 1110   CO2 21 10/08/2014 2004   BUN 17.8 11/11/2014 1110   BUN 11 10/08/2014 2004   CREATININE 0.7 11/11/2014 1110   CREATININE 0.74 10/08/2014 2004      Component Value Date/Time   CALCIUM 8.7 11/11/2014 1110   CALCIUM 9.2 10/08/2014 2004   ALKPHOS 55 11/11/2014 1110   ALKPHOS 44 10/08/2014 2004   AST 11 11/11/2014 1110   AST 18 10/08/2014 2004   ALT 41 11/11/2014 1110   ALT 16 10/08/2014 2004   BILITOT 0.38 11/11/2014 1110   BILITOT 0.4 10/08/2014 2004       RADIOGRAPHIC STUDIES: Ct Chest W Contrast  10/22/2014   CLINICAL DATA:  Brain lesions on MRI. Right upper lobe lung mass on CT. Two month history of vertigo.  EXAM: CT CHEST WITH CONTRAST  TECHNIQUE: Multidetector CT imaging of the chest was performed during intravenous contrast administration.  CONTRAST:  8m OMNIPAQUE IOHEXOL 300 MG/ML  SOLN  COMPARISON:  Chest radiograph of 10/08/2014. Brain MRI of 10/08/2014.  FINDINGS: Mediastinum/Nodes: No supraclavicular adenopathy.  Aortic and branch vessel atherosclerosis. Bovine arch. Normal heart size. Trace anterior pericardial fluid or thickening. Likely physiologic. No central pulmonary embolism, on this non-dedicated study. No mediastinal or hilar  adenopathy.  Lungs/Pleura: Narrowing of the right upper lobe segmental bronchi secondary to tumor to be described below. Other airways are within normal limits.  Mild to moderate centrilobular emphysema.  3 mm right apical lung nodule on image 11.  Central and inferior right upper lobe lung mass. Spiculated, measuring 3.9 x 2.9 cm on transverse image 25. 3.1 cm craniocaudal on sagittal image 52. Contacts the right major fissure. Causes mild volume loss in the anterior right upper lobe.  3 mm lingular nodule on image 42.  No pleural fluid.  Upper abdomen: Normal imaged portions of the liver, spleen, stomach, pancreas, kidneys. Minimal right adrenal thickening. Left adrenal gland is enlarged and nodular. Measures 3.2 x 3.5 cm. Maintains its adreniform shape. Incompletely imaged.  Musculoskeletal: Subchondral cyst formation within the right glenoid. Incompletely imaged lucency in the C7 vertebral body on image 1 may be degenerative. Incompletely imaged.  IMPRESSION: 1. Right upper lobe primary bronchogenic carcinoma. 2. No evidence of thoracic nodal metastasis. 3. Moderate to marked left adrenal enlargement. Although the gland maintains its adreniform shape, this is suspicious for 1 or more adrenal metastasis. 4. Incompletely imaged lucency within C7. This could be degenerative. Isolated osseous metastasis cannot be excluded. 5. Above findings could be further evaluated with PET, if indicated.   Electronically Signed   By: KAdria DevonD.  On: 10/22/2014 16:52   Mr Jeri Cos QQ Contrast  11/02/2014   CLINICAL DATA:  Right upper lobe lung cancer with brain metastases. SRS targeting.  EXAM: MRI HEAD WITHOUT AND WITH CONTRAST  TECHNIQUE: Multiplanar, multiecho pulse sequences of the brain and surrounding structures were obtained without and with intravenous contrast.  CONTRAST:  80mL MULTIHANCE GADOBENATE DIMEGLUMINE 529 MG/ML IV SOLN  COMPARISON:  10/08/2014  FINDINGS: There is no evidence of acute large territory  infarct. There is a punctate, 2 mm focus of cortical restricted diffusion in the posterior left frontal lobe (series 4, image 42) which could reflect a tiny, acute small vessel infarct or nonenhancing metastasis. Many of the brain masses demonstrate some degree of restricted diffusion as well as susceptibility artifact consistent with chronic blood products. There is mild to moderate vasogenic edema associated with multiple lesions, overall decreased from the prior study and greatest in the left frontal lobe.  Enhancing brain lesions are as follows: 6 mm medial inferior left cerebellar hemisphere (image 21, previously 8 mm), 6 mm superior left cerebellar vermis (image 46, previously 11 mm), 5 mm medial right occipital lobe (image 59, previously 4 mm), 2.5 x 2.0 cm right temporal-occipital region (image 65, previously 2.6 x 2.1 cm), 2.8 x 1.9 cm right occipital lobe posterior to the atrium (image 81, previously 3.5 x 2.2 cm), 1.5 x 1.1 cm left occipital lobe (image 46, previously 1.6 x 1.2 cm), 1.7 x 1.2 cm right parietal lobe (image 116, previously 1.8 x 1.4 cm), 2 mm inferolateral left parietal lobe (image 91, not well seen on the prior study), 3 mm high left parietal lobe (image 117, previously 3 mm), 1.8 x 1.7 cm right frontal lobe (image 128, previously 1.9 x 1.9 cm), 2.6 x 1.9 cm medial left frontal lobe (image 132, previously 2.4 x 2.1 cm), and 2 mm anterior left frontal lobe (image 90, previously 2 mm).  Additional punctate foci of enhancement questioned in the left parietal lobe on series 12, image 36 of the prior study and in the right frontal lobe on series 12, image 29 of the prior study are not identified on the current examination and were likely artifactual. There is no significant midline shift. There is no extra-axial fluid collection. There is mild generalized cerebral atrophy.  Orbits are unremarkable. Scattered, mild paranasal sinus mucosal thickening has decreased from the prior study. Mastoid  air cells are clear. Major intracranial vascular flow voids are preserved.  IMPRESSION: 1. At least 12 brain metastases as above, overall stable to slightly smaller than on the prior MRI. Decreased vasogenic edema. 2. Punctate small vessel infarct versus nonenhancing metastasis in the posterior left frontal lobe.   Electronically Signed   By: Logan Bores   On: 11/02/2014 11:42   Nm Pet Image Initial (pi) Skull Base To Thigh  11/09/2014   CLINICAL DATA:  Initial treatment strategy for lung carcinoma. Brain metastasis.  EXAM: NUCLEAR MEDICINE PET SKULL BASE TO THIGH  TECHNIQUE: 8.4 mCi F-18 FDG was injected intravenously. Full-ring PET imaging was performed from the skull base to thigh after the radiotracer. CT data was obtained and used for attenuation correction and anatomic localization.  FASTING BLOOD GLUCOSE:  Value: 86 mg/dl  COMPARISON:  Brain MRI 10/08/2014  FINDINGS: NECK  No hypermetabolic lymph nodes in the neck. The intracranial metastasis are faintly seen within the right occipital lobe. More superior lesions are not well demonstrated on the background of high cortical normal brain activity.  CHEST  Hypermetabolic right  perihilar mass extending upper lobe measuring 4.1 x 2.9 cm. This mass is intensely metabolic peripherally with SUV max 7.2. There is branching nodularity peripheral to this mass lesion which is mildly metabolic which could represent postobstructive pneumonitis. No hypermetabolic mediastinal lymph nodes.  ABDOMEN/PELVIS  There is enlarged hypermetabolic left adrenal gland measuring 3.6 x 3.4 cm with SUV max equal 6. No abnormal metabolic activity within the liver. There is a hypermetabolic periportal lymph node which is difficult to define on the CT with focally hypermetabolic adjacent head of the pancreas (image 149 of the fused data set).  There is a hypermetabolic soft tissue metastasis within the left paraspinal musculature measuring 3 cm adjacent to the L3 transverse process. This  is intensely hypermetabolic with SUV max 6.0 on image 155. There is second muscular metastasis within the anterior right shoulder muscles on image 88. Small hypodense lesion in the muscle on CT measuring 12 mm on image 83 series 2.  SKELETON  No focal hypermetabolic activity to suggest skeletal metastasis.  IMPRESSION: 1. Hypermetabolic right perihilar mass consists with primary bronchogenic carcinoma. 2. Nodular airspace disease peripheral to the right upper lobe mass representing postobstructive pneumonitis versus tumor spread. 3. Enlarged hypermetabolic left adrenal metastasis. 4. Hypermetabolic muscular metastasis in the left paravertebral musculature and right shoulder musculature. 5. Hypermetabolic focus adjacent head of pancreas likely represents a metastatic lymph node. 6. Brain metastasis better evaluated on MRI.   Electronically Signed   By: Suzy Bouchard M.D.   On: 11/09/2014 16:08    ASSESSMENT AND PLAN: This is a very pleasant 54 years old white male recently diagnosed with a stage IV non-small cell lung cancer, adenocarcinoma with negative EGFR mutation and negative ALK gene translocation.  He is currently undergoing whole brain irradiation for the multiple brain metastasis. I recommended for him to complete this course of radiation. I will start tapering his Decadron dose. For this week, the patient will start on 4 mg by mouth 3 times a day followed by 1 week of 4 mg by mouth twice a day followed by 1 week of 4 mg by mouth daily. I also discussed with the patient his systemic treatment options including palliative care and hospice referral versus systemic chemotherapy with carboplatin and Alimta. He is interested in proceeding with systemic chemotherapy. I discussed with the patient adverse effects of the chemotherapy including but not limited to mild alopecia, myelosuppression, nausea and vomiting, peripheral neuropathy, liver or renal dysfunction. I will arrange for the patient to have a  chemotherapy education class before starting the first cycle of his treatment in 2 weeks after completion of the whole brain irradiation. The patient will receive vitamin B 12 injection today. I will call his pharmacy with prescription for Compazine 10 mg by mouth every 6 hours as needed for nausea, folic acid 1 mg by mouth daily in addition to Decadron 4 mg by mouth twice a day the day before, day of and day after the chemotherapy every 3 weeks. The patient would come back for follow-up visit in 3 weeks for reevaluation and management of the adverse effect of his chemotherapy. He was advised to call immediately if he has any concerning symptoms in the interval. The patient voices understanding of current disease status and treatment options and is in agreement with the current care plan.  All questions were answered. The patient knows to call the clinic with any problems, questions or concerns. We can certainly see the patient much sooner if necessary.  I spent  20 minutes counseling the patient face to face. The total time spent in the appointment was 30 minutes.  Disclaimer: This note was dictated with voice recognition software. Similar sounding words can inadvertently be transcribed and may not be corrected upon review.

## 2014-11-11 NOTE — Telephone Encounter (Signed)
Per staff message and POF I have scheduled appts. Advised scheduler of appts. JMW  

## 2014-11-11 NOTE — Telephone Encounter (Signed)
Gave avs & calendar for February. Sent message to schedule treatment. °

## 2014-11-12 ENCOUNTER — Encounter: Payer: Self-pay | Admitting: *Deleted

## 2014-11-12 ENCOUNTER — Ambulatory Visit
Admission: RE | Admit: 2014-11-12 | Discharge: 2014-11-12 | Disposition: A | Discharge status: Home / Self Care | Payer: PRIVATE HEALTH INSURANCE | Source: Ambulatory Visit | Attending: Radiation Oncology | Admitting: Radiation Oncology

## 2014-11-12 DIAGNOSIS — Z510 Encounter for antineoplastic radiation therapy: Secondary | ICD-10-CM | POA: Diagnosis not present

## 2014-11-12 NOTE — Progress Notes (Signed)
[place] Work  Clinical Social Work met with patient/family during medical oncology appointment to review social security disability process.  CSW assisted patient in obtaining medical records and completed Lung Cancer [employer] application.  Polo Riley, MSW, LCSW, OSW-C Clinical Social Worker [facility] [phone]

## 2014-11-13 ENCOUNTER — Ambulatory Visit
Admission: RE | Admit: 2014-11-13 | Discharge: 2014-11-13 | Disposition: A | Discharge status: Home / Self Care | Payer: PRIVATE HEALTH INSURANCE | Source: Ambulatory Visit | Attending: Radiation Oncology | Admitting: Radiation Oncology

## 2014-11-13 ENCOUNTER — Encounter: Payer: Self-pay | Admitting: Radiation Oncology

## 2014-11-13 VITALS — BP 130/78 | HR 71 | Temp 98.1°F | Ht 70.0 in | Wt 163.4 lb

## 2014-11-13 DIAGNOSIS — C7931 Secondary malignant neoplasm of brain: Secondary | ICD-10-CM

## 2014-11-13 DIAGNOSIS — Z510 Encounter for antineoplastic radiation therapy: Secondary | ICD-10-CM | POA: Diagnosis not present

## 2014-11-13 NOTE — Progress Notes (Signed)
  Radiation Oncology         [phone] ________________________________  Name: Jason Keller MRN: [id number]  Date: 11/13/2014  DOB: 07/21/1961  Weekly Radiation Therapy Management    ICD-9-CM ICD-10-CM   1. Brain metastases 198.3 C79.31     Current Dose: 17.5 Gy     Planned Dose:  35 Gy  Narrative . . . . . . . . The patient presents for routine under treatment assessment.                                   The patient is without complaint.                                 Set-up films were reviewed.                                 The chart was checked. Physical Findings. . .  height is 5\' 10"  (1.778 m) and weight is 163 lb 6.4 oz (74.118 kg). His temperature is 98.1 F (36.7 C). His blood pressure is 130/78 and his pulse is 71. . Weight essentially stable.  No significant changes. Impression . . . . . . . The patient is tolerating radiation. Plan . . . . . . . . . . . . Continue treatment as planned.  ________________________________  Sheral Apley. Tammi Klippel, M.D.

## 2014-11-13 NOTE — Progress Notes (Signed)
Mr. Broadnax has received 7 fractions to his whole brain,.  He reports a level 2/10 headache, top of skull today.  Taking Hydrocodone for pain.  He reports that he has dizziness that is intermittent in nature.  Reports seeing shadows from his left visual field.  Denies any nausea/vomiting and no issues with fine motor movement.

## 2014-11-16 ENCOUNTER — Telehealth: Payer: Self-pay | Admitting: *Deleted

## 2014-11-16 ENCOUNTER — Ambulatory Visit
Admission: RE | Admit: 2014-11-16 | Discharge: 2014-11-16 | Disposition: A | Discharge status: Home / Self Care | Payer: PRIVATE HEALTH INSURANCE | Source: Ambulatory Visit | Attending: Radiation Oncology | Admitting: Radiation Oncology

## 2014-11-16 DIAGNOSIS — Z510 Encounter for antineoplastic radiation therapy: Secondary | ICD-10-CM | POA: Diagnosis not present

## 2014-11-16 NOTE — Telephone Encounter (Signed)
On 11-16-14 pt came by pick up medical records it was consult note, tx & sim planning note.

## 2014-11-17 ENCOUNTER — Ambulatory Visit
Admission: RE | Admit: 2014-11-17 | Discharge: 2014-11-17 | Disposition: A | Discharge status: Home / Self Care | Payer: PRIVATE HEALTH INSURANCE | Source: Ambulatory Visit | Attending: Radiation Oncology | Admitting: Radiation Oncology

## 2014-11-17 DIAGNOSIS — Z510 Encounter for antineoplastic radiation therapy: Secondary | ICD-10-CM | POA: Diagnosis not present

## 2014-11-18 ENCOUNTER — Ambulatory Visit
Admission: RE | Admit: 2014-11-18 | Discharge: 2014-11-18 | Disposition: A | Discharge status: Home / Self Care | Payer: PRIVATE HEALTH INSURANCE | Source: Ambulatory Visit | Attending: Radiation Oncology | Admitting: Radiation Oncology

## 2014-11-18 DIAGNOSIS — Z510 Encounter for antineoplastic radiation therapy: Secondary | ICD-10-CM | POA: Diagnosis not present

## 2014-11-19 ENCOUNTER — Ambulatory Visit
Admission: RE | Admit: 2014-11-19 | Discharge: 2014-11-19 | Disposition: A | Discharge status: Home / Self Care | Payer: PRIVATE HEALTH INSURANCE | Source: Ambulatory Visit | Attending: Radiation Oncology | Admitting: Radiation Oncology

## 2014-11-19 DIAGNOSIS — Z510 Encounter for antineoplastic radiation therapy: Secondary | ICD-10-CM | POA: Diagnosis not present

## 2014-11-20 ENCOUNTER — Ambulatory Visit
Admission: RE | Admit: 2014-11-20 | Discharge: 2014-11-20 | Disposition: A | Discharge status: Home / Self Care | Payer: PRIVATE HEALTH INSURANCE | Source: Ambulatory Visit | Attending: Radiation Oncology | Admitting: Radiation Oncology

## 2014-11-20 ENCOUNTER — Encounter: Payer: Self-pay | Admitting: Radiation Oncology

## 2014-11-20 VITALS — BP 152/87 | HR 69 | Resp 16 | Wt 161.5 lb

## 2014-11-20 DIAGNOSIS — Z510 Encounter for antineoplastic radiation therapy: Secondary | ICD-10-CM | POA: Diagnosis not present

## 2014-11-20 DIAGNOSIS — C7931 Secondary malignant neoplasm of brain: Secondary | ICD-10-CM

## 2014-11-20 NOTE — Progress Notes (Signed)
Reports tapering down yesterday to decadron 4 mg bid. Reports waking up each day with a headache but, most recently an occipital headache. Reports taking one hydrocodone tablet before bed and 1/2 tablet during the day. Hair thinning so patient shaved his head. Patient understands the importance of protecting his scalp from sunlight. Reports using biafine bid as directed. Denies nausea, vomiting, dizziness, diplopia or ringing in the ears.

## 2014-11-20 NOTE — Progress Notes (Signed)
  Radiation Oncology         [phone] ________________________________  Name: Jason Keller MRN: [id number]  Date: 11/20/2014  DOB: 1961/05/07     Weekly Radiation Therapy Management    ICD-9-CM ICD-10-CM   1. Brain metastases 198.3 C79.31     Current Dose: 30 Gy     Planned Dose:  35 Gy  Narrative . . . . . . . . The patient presents for routine under treatment assessment.                                  Reports tapering down yesterday to decadron 4 mg bid. Reports waking up each day with a headache but, most recently an occipital headache. Reports taking one hydrocodone tablet before bed and 1/2 tablet during the day. Hair thinning so patient shaved his head. Patient understands the importance of protecting his scalp from sunlight. Reports using biafine bid as directed. Denies nausea, vomiting, dizziness, diplopia or ringing in the ears.                                  Set-up films were reviewed.                                 The chart was checked. Physical Findings. . .  weight is 161 lb 8 oz (73.256 kg). His blood pressure is 152/87 and his pulse is 69. His respiration is 16. . Weight essentially stable.  No significant changes. Impression . . . . . . . The patient is tolerating radiation. Plan . . . . . . . . . . . . Continue treatment as planned.  ________________________________  Sheral Apley. Tammi Klippel, M.D.

## 2014-11-23 ENCOUNTER — Ambulatory Visit
Admission: RE | Admit: 2014-11-23 | Discharge: 2014-11-23 | Disposition: A | Discharge status: Home / Self Care | Payer: PRIVATE HEALTH INSURANCE | Source: Ambulatory Visit | Attending: Radiation Oncology | Admitting: Radiation Oncology

## 2014-11-23 ENCOUNTER — Ambulatory Visit: Payer: PRIVATE HEALTH INSURANCE

## 2014-11-23 DIAGNOSIS — Z510 Encounter for antineoplastic radiation therapy: Secondary | ICD-10-CM | POA: Diagnosis not present

## 2014-11-24 ENCOUNTER — Other Ambulatory Visit: Payer: PRIVATE HEALTH INSURANCE

## 2014-11-24 ENCOUNTER — Ambulatory Visit: Payer: PRIVATE HEALTH INSURANCE

## 2014-11-24 ENCOUNTER — Ambulatory Visit
Admission: RE | Admit: 2014-11-24 | Discharge: 2014-11-24 | Disposition: A | Discharge status: Home / Self Care | Payer: PRIVATE HEALTH INSURANCE | Source: Ambulatory Visit | Attending: Radiation Oncology | Admitting: Radiation Oncology

## 2014-11-24 ENCOUNTER — Encounter: Payer: Self-pay | Admitting: Radiation Oncology

## 2014-11-24 ENCOUNTER — Encounter: Payer: Self-pay | Admitting: *Deleted

## 2014-11-24 DIAGNOSIS — Z510 Encounter for antineoplastic radiation therapy: Secondary | ICD-10-CM | POA: Diagnosis not present

## 2014-11-24 NOTE — Progress Notes (Signed)
Patient completed radiation treatment today. Patient scheduled for chemo education now. Vitals stable. Patient without complaints. Hyperpigmentation without desquamation of scalp noted. Patient understands to continue Biafine bid for the next two week. Also, patient understands to contact this nurse with future needs. Patient taking decadron 4 mg bid present but, will taper down in the next two days. Patient has no questions reference decadron taper. One month follow up appointment card given.

## 2014-11-24 NOTE — Progress Notes (Signed)
  Radiation Oncology         [phone]) [phone] ________________________________  Name: DAGAN HEINZ MRN: [id number]  Date: 11/24/2014  DOB: Jun 07, 1961  End of Treatment Note    ICD-9-CM ICD-10-CM   1. Brain metastases 198.3 C79.31     DIAGNOSIS: 54 year old gentleman with 12 brain metastases from metastatic adenocarcinoma the right upper lung  Indication for treatment:  Palliation       Radiation treatment dates:   11/04/2014-11/24/2014  Site/dose:   The whole brain was treated to 35 Gy in 14 fractions of 2.5 Gy  Beams/energy:   Right and Left radiation fields were treated using 6 MV X-rays with custom MLC collimation to shield the eyes and face.  The patient was immobilized with a thermoplastic mask and isocenter was verified with weekly port films.  Narrative: The patient tolerated radiation treatment relatively well.   He had expected scalp epilation.  Plan: The patient has completed radiation treatment. The patient will return to radiation oncology clinic for routine followup in one month. I advised them to call or return sooner if they have any questions or concerns related to their recovery or treatment. ________________________________  Sheral Apley. Tammi Klippel, M.D.

## 2014-11-25 ENCOUNTER — Ambulatory Visit: Payer: PRIVATE HEALTH INSURANCE

## 2014-11-26 ENCOUNTER — Other Ambulatory Visit (HOSPITAL_BASED_OUTPATIENT_CLINIC_OR_DEPARTMENT_OTHER): Payer: PRIVATE HEALTH INSURANCE

## 2014-11-26 ENCOUNTER — Ambulatory Visit (HOSPITAL_BASED_OUTPATIENT_CLINIC_OR_DEPARTMENT_OTHER): Payer: PRIVATE HEALTH INSURANCE

## 2014-11-26 DIAGNOSIS — C3411 Malignant neoplasm of upper lobe, right bronchus or lung: Secondary | ICD-10-CM

## 2014-11-26 DIAGNOSIS — C3491 Malignant neoplasm of unspecified part of right bronchus or lung: Secondary | ICD-10-CM

## 2014-11-26 DIAGNOSIS — Z5111 Encounter for antineoplastic chemotherapy: Secondary | ICD-10-CM

## 2014-11-26 LAB — CBC WITH DIFFERENTIAL/PLATELET
BASO%: 0.1 % (ref 0.0–2.0)
BASOS ABS: 0 10*3/uL (ref 0.0–0.1)
EOS%: 0 % (ref 0.0–7.0)
Eosinophils Absolute: 0 10*3/uL (ref 0.0–0.5)
HEMATOCRIT: 43.9 % (ref 38.4–49.9)
HEMOGLOBIN: 15.4 g/dL (ref 13.0–17.1)
LYMPH#: 0.5 10*3/uL — AB (ref 0.9–3.3)
LYMPH%: 3.7 % — ABNORMAL LOW (ref 14.0–49.0)
MCH: 31.8 pg (ref 27.2–33.4)
MCHC: 35.1 g/dL (ref 32.0–36.0)
MCV: 90.7 fL (ref 79.3–98.0)
MONO#: 0.7 10*3/uL (ref 0.1–0.9)
MONO%: 5.4 % (ref 0.0–14.0)
NEUT#: 11.2 10*3/uL — ABNORMAL HIGH (ref 1.5–6.5)
NEUT%: 90.8 % — AB (ref 39.0–75.0)
Platelets: 191 10*3/uL (ref 140–400)
RBC: 4.84 10*6/uL (ref 4.20–5.82)
RDW: 14.4 % (ref 11.0–14.6)
WBC: 12.3 10*3/uL — ABNORMAL HIGH (ref 4.0–10.3)
nRBC: 0 % (ref 0–0)

## 2014-11-26 LAB — COMPREHENSIVE METABOLIC PANEL (CC13)
ALBUMIN: 3.3 g/dL — AB (ref 3.5–5.0)
ALT: 49 U/L (ref 0–55)
ANION GAP: 11 meq/L (ref 3–11)
AST: 11 U/L (ref 5–34)
Alkaline Phosphatase: 52 U/L (ref 40–150)
BILIRUBIN TOTAL: 0.29 mg/dL (ref 0.20–1.20)
BUN: 23.4 mg/dL (ref 7.0–26.0)
CO2: 24 mEq/L (ref 22–29)
Calcium: 8.9 mg/dL (ref 8.4–10.4)
Chloride: 101 mEq/L (ref 98–109)
Creatinine: 0.7 mg/dL (ref 0.7–1.3)
GLUCOSE: 101 mg/dL (ref 70–140)
POTASSIUM: 4 meq/L (ref 3.5–5.1)
Sodium: 136 mEq/L (ref 136–145)
TOTAL PROTEIN: 6.1 g/dL — AB (ref 6.4–8.3)

## 2014-11-26 MED ORDER — SODIUM CHLORIDE 0.9 % IV SOLN
Freq: Once | INTRAVENOUS | Status: AC
  Administered 2014-11-26: 14:00:00 via INTRAVENOUS

## 2014-11-26 MED ORDER — ONDANSETRON 16 MG/50ML IVPB (CHCC)
16.0000 mg | Freq: Once | INTRAVENOUS | Status: AC
  Administered 2014-11-26: 16 mg via INTRAVENOUS

## 2014-11-26 MED ORDER — PEMETREXED DISODIUM CHEMO INJECTION 500 MG
500.0000 mg/m2 | Freq: Once | INTRAVENOUS | Status: AC
  Administered 2014-11-26: 950 mg via INTRAVENOUS
  Filled 2014-11-26: qty 38

## 2014-11-26 MED ORDER — DEXAMETHASONE SODIUM PHOSPHATE 20 MG/5ML IJ SOLN
20.0000 mg | Freq: Once | INTRAMUSCULAR | Status: AC
  Administered 2014-11-26: 20 mg via INTRAVENOUS

## 2014-11-26 MED ORDER — DEXAMETHASONE SODIUM PHOSPHATE 20 MG/5ML IJ SOLN
INTRAMUSCULAR | Status: AC
  Filled 2014-11-26: qty 5

## 2014-11-26 MED ORDER — ONDANSETRON 16 MG/50ML IVPB (CHCC)
INTRAVENOUS | Status: AC
  Filled 2014-11-26: qty 16

## 2014-11-26 MED ORDER — SODIUM CHLORIDE 0.9 % IV SOLN
674.0000 mg | Freq: Once | INTRAVENOUS | Status: AC
  Administered 2014-11-26: 670 mg via INTRAVENOUS
  Filled 2014-11-26: qty 67

## 2014-11-26 NOTE — Patient Instructions (Signed)
[place] Discharge Instructions for Patients Receiving Chemotherapy  Today you received the following chemotherapy agents: Alimta and Carboplatin.  To help prevent nausea and vomiting after your treatment, we encourage you to take your nausea medication as prescribed.   If you develop nausea and vomiting that is not controlled by your nausea medication, call the clinic.   BELOW ARE SYMPTOMS THAT SHOULD BE REPORTED IMMEDIATELY:  *FEVER GREATER THAN 100.5 F  *CHILLS WITH OR WITHOUT FEVER  NAUSEA AND VOMITING THAT IS NOT CONTROLLED WITH YOUR NAUSEA MEDICATION  *UNUSUAL SHORTNESS OF BREATH  *UNUSUAL BRUISING OR BLEEDING  TENDERNESS IN MOUTH AND THROAT WITH OR WITHOUT PRESENCE OF ULCERS  *URINARY PROBLEMS  *BOWEL PROBLEMS  UNUSUAL RASH Items with * indicate a potential emergency and should be followed up as soon as possible.  Feel free to call the clinic you have any questions or concerns. The clinic phone number is [phone].

## 2014-11-27 ENCOUNTER — Telehealth: Payer: Self-pay | Admitting: *Deleted

## 2014-11-27 NOTE — Telephone Encounter (Signed)
-----   Message from Braulio Bosch, RN sent at 11/26/2014  3:00 PM EST ----- Regarding: Chemo Follow up call Contact: [phone] First time chemo with Alimta and Carboplatin. Dr. Julien Nordmann.

## 2014-11-27 NOTE — Telephone Encounter (Signed)
Chemotherapy follow up: No complaints. Informed patient to take nausea medication if needed, drink plenty of fluids, and to call clinic if he has any issues. Patient verbalized understanding.

## 2014-12-03 ENCOUNTER — Telehealth: Payer: Self-pay | Admitting: Physician Assistant

## 2014-12-03 ENCOUNTER — Encounter: Payer: Self-pay | Admitting: Physician Assistant

## 2014-12-03 ENCOUNTER — Other Ambulatory Visit (HOSPITAL_BASED_OUTPATIENT_CLINIC_OR_DEPARTMENT_OTHER): Payer: PRIVATE HEALTH INSURANCE

## 2014-12-03 ENCOUNTER — Ambulatory Visit (HOSPITAL_BASED_OUTPATIENT_CLINIC_OR_DEPARTMENT_OTHER): Payer: PRIVATE HEALTH INSURANCE | Admitting: Physician Assistant

## 2014-12-03 VITALS — BP 125/69 | HR 85 | Temp 98.6°F | Resp 20 | Ht 70.0 in | Wt 158.0 lb

## 2014-12-03 DIAGNOSIS — C3491 Malignant neoplasm of unspecified part of right bronchus or lung: Secondary | ICD-10-CM

## 2014-12-03 DIAGNOSIS — C7931 Secondary malignant neoplasm of brain: Secondary | ICD-10-CM

## 2014-12-03 DIAGNOSIS — R918 Other nonspecific abnormal finding of lung field: Secondary | ICD-10-CM

## 2014-12-03 DIAGNOSIS — C3411 Malignant neoplasm of upper lobe, right bronchus or lung: Secondary | ICD-10-CM

## 2014-12-03 DIAGNOSIS — R05 Cough: Secondary | ICD-10-CM

## 2014-12-03 LAB — CBC WITH DIFFERENTIAL/PLATELET
BASO%: 0.2 % (ref 0.0–2.0)
BASOS ABS: 0 10*3/uL (ref 0.0–0.1)
EOS%: 0.7 % (ref 0.0–7.0)
Eosinophils Absolute: 0 10*3/uL (ref 0.0–0.5)
HCT: 37.9 % — ABNORMAL LOW (ref 38.4–49.9)
HGB: 13.2 g/dL (ref 13.0–17.1)
LYMPH%: 23.2 % (ref 14.0–49.0)
MCH: 31.8 pg (ref 27.2–33.4)
MCHC: 34.8 g/dL (ref 32.0–36.0)
MCV: 91.3 fL (ref 79.3–98.0)
MONO#: 0.6 10*3/uL (ref 0.1–0.9)
MONO%: 9.5 % (ref 0.0–14.0)
NEUT#: 4.1 10*3/uL (ref 1.5–6.5)
NEUT%: 66.4 % (ref 39.0–75.0)
PLATELETS: 152 10*3/uL (ref 140–400)
RBC: 4.15 10*6/uL — AB (ref 4.20–5.82)
RDW: 13.9 % (ref 11.0–14.6)
WBC: 6.1 10*3/uL (ref 4.0–10.3)
lymph#: 1.4 10*3/uL (ref 0.9–3.3)

## 2014-12-03 LAB — COMPREHENSIVE METABOLIC PANEL (CC13)
ALK PHOS: 48 U/L (ref 40–150)
ALT: 37 U/L (ref 0–55)
AST: 13 U/L (ref 5–34)
Albumin: 3 g/dL — ABNORMAL LOW (ref 3.5–5.0)
Anion Gap: 10 mEq/L (ref 3–11)
BUN: 12.9 mg/dL (ref 7.0–26.0)
CO2: 25 mEq/L (ref 22–29)
Calcium: 9 mg/dL (ref 8.4–10.4)
Chloride: 102 mEq/L (ref 98–109)
Creatinine: 0.7 mg/dL (ref 0.7–1.3)
EGFR: >90 mL/min/{1.73_m2} (ref >90)
Glucose: 88 mg/dl (ref 70–140)
POTASSIUM: 4 meq/L (ref 3.5–5.1)
Sodium: 137 mEq/L (ref 136–145)
Total Bilirubin: 0.31 mg/dL (ref 0.20–1.20)
Total Protein: 6 g/dL — ABNORMAL LOW (ref 6.4–8.3)

## 2014-12-03 MED ORDER — HYDROCODONE-ACETAMINOPHEN 5-325 MG PO TABS: 1.0000 | ORAL_TABLET | ORAL | Status: DC | PRN

## 2014-12-03 NOTE — Progress Notes (Addendum)
[place] [phone]   [phone]  OFFICE PROGRESS NOTE  No PCP Per Patient No address on file  DIAGNOSIS: Stage IV (T2a, N0, M1b) non-small cell lung cancer, adenocarcinoma, with negative EGFR mutation and negative ALK gene translocation presented with large right upper lobe lung mass in addition to innumerable brain metastasis and questionable adrenal and bone metastasis diagnosed in January 2016.  PRIOR THERAPY: Whole brain irradiation under the care of Dr. Tammi Klippel. Completed 11/24/2014  CURRENT THERAPY: Systemic chemotherapy with carboplatin for an ECF 5 and Alimta at 500 mg/m given every 3 weeks. Status post 1 cycle  INTERVAL HISTORY: Jason Keller 54 y.o. male returns to the clinic today for a symptom management  accompanied by his wife. He completed his dexamethasone taper as directed on Tuesday of this week but has developed some headaches. He reports that he was told that if he had headaches he may be able to take a small dose of dexamethasone. He reports he went "congested cough that is productive of yellow to green secretions. He denied any fever or chills. He requests a refill for his hydrocodone. The patient denied having any significant chest pain, shortness breath,  or hemoptysis. He denied having any significant weight loss or night sweats. He has no nausea or vomiting, no fever or chills.   MEDICAL HISTORY: Past Medical History  Diagnosis Date  . Vertigo   . 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 Brain cancer     Invasive poorly differentiated adenocarcinoma RUL with 12 brain mets    ALLERGIES:  is allergic to penicillins.  MEDICATIONS:  Current Outpatient Prescriptions  Medication Sig Dispense Refill  . emollient (BIAFINE) cream Apply topically as needed.    . famotidine (PEPCID) 20 MG tablet One at bedtime 30 tablet 2  . folic acid (FOLVITE) 1 MG tablet Take 1 tablet  (1 mg total) by mouth daily. 30 tablet 4  . HYDROcodone-acetaminophen (NORCO) 5-325 MG per tablet Take 1 tablet by mouth every 4 (four) hours as needed for moderate pain. 40 tablet 0  . meclizine (ANTIVERT) 12.5 MG tablet Take 12.5 mg by mouth 2 (two) times daily as needed for dizziness.    . pantoprazole (PROTONIX) 40 MG tablet Take 1 tablet (40 mg total) by mouth daily. Take 30-60 min before first meal of the day 30 tablet 2  . prochlorperazine (COMPAZINE) 10 MG tablet Take 1 tablet (10 mg total) by mouth every 6 (six) hours as needed for nausea or vomiting. 60 tablet 0  . dexamethasone (DECADRON) 4 MG tablet Take 1 tablet (4 mg total) by mouth 4 (four) times daily. (Patient not taking: Reported on 12/03/2014) 60 tablet 0   No current facility-administered medications for this visit.   Facility-Administered Medications Ordered in Other Visits  Medication Dose Route Frequency Provider Last Rate Last Dose  . topical emolient (BIAFINE) emulsion   Topical Daily [name], MD        SURGICAL HISTORY:  Past Surgical History  Procedure Laterality Date  . Video bronchoscopy Bilateral 10/26/2014    Procedure: VIDEO BRONCHOSCOPY WITHOUT FLUORO;  Surgeon: Tanda Rockers, MD;  Location: [facility];  Service: Cardiopulmonary;  Laterality: Bilateral;    REVIEW OF SYSTEMS:  Constitutional: negative Eyes: negative Ears, nose, mouth, throat, and face: negative Respiratory: negative Cardiovascular: negative Gastrointestinal: negative Genitourinary:negative Integument/breast: negative Hematologic/lymphatic: negative Musculoskeletal:negative Neurological: negative Behavioral/Psych: negative Endocrine: negative  Allergic/Immunologic: negative   PHYSICAL EXAMINATION: General appearance: alert, cooperative and no distress Head: Normocephalic, without obvious abnormality, atraumatic Neck: no adenopathy, no JVD, supple, symmetrical, trachea midline and thyroid not enlarged, symmetric, no  tenderness/mass/nodules Lymph nodes: Cervical, supraclavicular, and axillary nodes normal. Resp: clear to auscultation bilaterally Back: symmetric, no curvature. ROM normal. No CVA tenderness. Cardio: regular rate and rhythm, S1, S2 normal, no murmur, click, rub or gallop GI: soft, non-tender; bowel sounds normal; no masses,  no organomegaly Extremities: extremities normal, atraumatic, no cyanosis or edema Neurologic: Alert and oriented X 3, normal strength and tone. Normal symmetric reflexes. Normal coordination and gait  ECOG PERFORMANCE STATUS: 1 - Symptomatic but completely ambulatory  Blood pressure 125/69, pulse 85, temperature 98.6 F (37 C), temperature source Oral, resp. rate 20, height 5\' 10"  (1.778 m), weight 158 lb (71.668 kg), SpO2 100 %.  LABORATORY DATA: Lab Results  Component Value Date   WBC 6.1 12/03/2014   HGB 13.2 12/03/2014   HCT 37.9* 12/03/2014   MCV 91.3 12/03/2014   PLT 152 12/03/2014      Chemistry      Component Value Date/Time   NA 137 12/03/2014 1511   NA 138 10/08/2014 2004   K 4.0 12/03/2014 1511   K 3.9 10/08/2014 2004   CL 106 10/08/2014 2004   CO2 25 12/03/2014 1511   CO2 21 10/08/2014 2004   BUN 12.9 12/03/2014 1511   BUN 11 10/08/2014 2004   CREATININE 0.7 12/03/2014 1511   CREATININE 0.74 10/08/2014 2004      Component Value Date/Time   CALCIUM 9.0 12/03/2014 1511   CALCIUM 9.2 10/08/2014 2004   ALKPHOS 48 12/03/2014 1511   ALKPHOS 44 10/08/2014 2004   AST 13 12/03/2014 1511   AST 18 10/08/2014 2004   ALT 37 12/03/2014 1511   ALT 16 10/08/2014 2004   BILITOT 0.31 12/03/2014 1511   BILITOT 0.4 10/08/2014 2004       RADIOGRAPHIC STUDIES: Nm Pet Image Initial (pi) Skull Base To Thigh  11/09/2014   CLINICAL DATA:  Initial treatment strategy for lung carcinoma. Brain metastasis.  EXAM: NUCLEAR MEDICINE PET SKULL BASE TO THIGH  TECHNIQUE: 8.4 mCi F-18 FDG was injected intravenously. Full-ring PET imaging was performed from the  skull base to thigh after the radiotracer. CT data was obtained and used for attenuation correction and anatomic localization.  FASTING BLOOD GLUCOSE:  Value: 86 mg/dl  COMPARISON:  Brain MRI 10/08/2014  FINDINGS: NECK  No hypermetabolic lymph nodes in the neck. The intracranial metastasis are faintly seen within the right occipital lobe. More superior lesions are not well demonstrated on the background of high cortical normal brain activity.  CHEST  Hypermetabolic right perihilar mass extending upper lobe measuring 4.1 x 2.9 cm. This mass is intensely metabolic peripherally with SUV max 7.2. There is branching nodularity peripheral to this mass lesion which is mildly metabolic which could represent postobstructive pneumonitis. No hypermetabolic mediastinal lymph nodes.  ABDOMEN/PELVIS  There is enlarged hypermetabolic left adrenal gland measuring 3.6 x 3.4 cm with SUV max equal 6. No abnormal metabolic activity within the liver. There is a hypermetabolic periportal lymph node which is difficult to define on the CT with focally hypermetabolic adjacent head of the pancreas (image 149 of the fused data set).  There is a hypermetabolic soft tissue metastasis within the left paraspinal musculature measuring 3 cm adjacent to the L3 transverse process. This is intensely hypermetabolic with SUV max 6.0 on image 155. There is second  muscular metastasis within the anterior right shoulder muscles on image 88. Small hypodense lesion in the muscle on CT measuring 12 mm on image 83 series 2.  SKELETON  No focal hypermetabolic activity to suggest skeletal metastasis.  IMPRESSION: 1. Hypermetabolic right perihilar mass consists with primary bronchogenic carcinoma. 2. Nodular airspace disease peripheral to the right upper lobe mass representing postobstructive pneumonitis versus tumor spread. 3. Enlarged hypermetabolic left adrenal metastasis. 4. Hypermetabolic muscular metastasis in the left paravertebral musculature and right  shoulder musculature. 5. Hypermetabolic focus adjacent head of pancreas likely represents a metastatic lymph node. 6. Brain metastasis better evaluated on MRI.   Electronically Signed   By: Suzy Bouchard M.D.   On: 11/09/2014 16:08    ASSESSMENT AND PLAN: This is a very pleasant 54 years old white male recently diagnosed with a stage IV non-small cell lung cancer, adenocarcinoma with negative EGFR mutation and negative ALK gene translocation.  He is currently undergoing whole brain irradiation for the multiple brain metastasis. He is completed his course of whole brain radiation and the care Dr. Tammi Klippel. He is now status post one cycle of systemic chemotherapy with carboplatin for an before meals of 5 and Alimta 500 mg/m given every 3 weeks. Patient was discussed with and also seen by Dr. Julien Nordmann. Patient shared that he was drinking "food grade hydrogen peroxide", 5 drops in distilled water. Patient was cautioned against dietary and over-the-counter measures that may be counter reductive to his treatment. Regarding his headaches he will restart dexamethasone at 2 mg by mouth twice daily. He was given a refill prescription for his hydrocodone. Regarding his cough patient was advised to try Mucinex and drink plenty of water. We'll continue to monitor the symptoms. Should he develop fever chills he is to notify us immediately. He will continue with weekly labs as scheduled and return in 2 weeks for another symptom management visit prior to the start of cycle #2.   He was advised to call immediately if he has any concerning symptoms in the interval. The patient voices understanding of current disease status and treatment options and is in agreement with the current care plan.  All questions were answered. The patient knows to call the clinic with any problems, questions or concerns. We can certainly see the patient much sooner if necessary.  Carlton Adam,  PA-C 12/03/2014  ADDENDUM: Hematology/Oncology Attending: I had a face to face encounter with the patient. I recommended his care plan. This is a very pleasant 54 years old white male recently diagnosed with a stage IV non-small cell lung cancer, adenocarcinoma with innumerable brain metastasis status post whole brain irradiation. He is currently undergoing systemic chemotherapy with carboplatin for AUC of 5 and Alimta 500 MG/M2 every 3 weeks. Status post 1 cycle and tolerated the first cycle of his treatment fairly well with no significant adverse effects. The patient has been using a lot of hydrogen peroxide recently with his diet. I recommended for him to hold on this supplements for now especially during the chemotherapy time. He would come back for follow-up visit in 2 weeks for reevaluation before starting cycle #2. He was given a refill of his pain medication. The patient was advised to call immediately if he has any concerning symptoms in the interval.   Disclaimer: This note was dictated with voice recognition software. Similar sounding words can inadvertently be transcribed and may not be corrected upon review. Eilleen Kempf., MD 12/12/2014

## 2014-12-03 NOTE — Telephone Encounter (Signed)
Pt confirmed labs/ov per 02/18 POF, gave pt AVS... KJ, sent msg to move chemo down due to adding MD visit.Marland KitchenMarland KitchenMarland Kitchen

## 2014-12-04 ENCOUNTER — Telehealth: Payer: Self-pay | Admitting: *Deleted

## 2014-12-04 NOTE — Telephone Encounter (Signed)
Per staff message and POF I have scheduled appts. Advised scheduler of appts. JMW  

## 2014-12-10 ENCOUNTER — Other Ambulatory Visit (HOSPITAL_BASED_OUTPATIENT_CLINIC_OR_DEPARTMENT_OTHER): Payer: PRIVATE HEALTH INSURANCE

## 2014-12-10 DIAGNOSIS — C3411 Malignant neoplasm of upper lobe, right bronchus or lung: Secondary | ICD-10-CM

## 2014-12-10 DIAGNOSIS — R918 Other nonspecific abnormal finding of lung field: Secondary | ICD-10-CM

## 2014-12-10 LAB — COMPREHENSIVE METABOLIC PANEL (CC13)
ALBUMIN: 3.2 g/dL — AB (ref 3.5–5.0)
ALK PHOS: 62 U/L (ref 40–150)
ALT: 23 U/L (ref 0–55)
ANION GAP: 12 meq/L — AB (ref 3–11)
AST: 10 U/L (ref 5–34)
BUN: 16.6 mg/dL (ref 7.0–26.0)
CHLORIDE: 100 meq/L (ref 98–109)
CO2: 24 meq/L (ref 22–29)
CREATININE: 0.7 mg/dL (ref 0.7–1.3)
Calcium: 9.3 mg/dL (ref 8.4–10.4)
Glucose: 102 mg/dl (ref 70–140)
Potassium: 4.1 mEq/L (ref 3.5–5.1)
Sodium: 136 mEq/L (ref 136–145)
Total Bilirubin: 0.21 mg/dL (ref 0.20–1.20)
Total Protein: 6.7 g/dL (ref 6.4–8.3)

## 2014-12-10 LAB — CBC WITH DIFFERENTIAL/PLATELET
BASO%: 0.5 % (ref 0.0–2.0)
BASOS ABS: 0.1 10*3/uL (ref 0.0–0.1)
EOS%: 0.1 % (ref 0.0–7.0)
Eosinophils Absolute: 0 10*3/uL (ref 0.0–0.5)
HCT: 42.8 % (ref 38.4–49.9)
HEMOGLOBIN: 14.2 g/dL (ref 13.0–17.1)
LYMPH%: 6.9 % — ABNORMAL LOW (ref 14.0–49.0)
MCH: 30.9 pg (ref 27.2–33.4)
MCHC: 33.3 g/dL (ref 32.0–36.0)
MCV: 92.8 fL (ref 79.3–98.0)
MONO#: 0.5 10*3/uL (ref 0.1–0.9)
MONO%: 4.8 % (ref 0.0–14.0)
NEUT#: 8.9 10*3/uL — ABNORMAL HIGH (ref 1.5–6.5)
NEUT%: 87.7 % — ABNORMAL HIGH (ref 39.0–75.0)
PLATELETS: 300 10*3/uL (ref 140–400)
RBC: 4.61 10*6/uL (ref 4.20–5.82)
RDW: 14.8 % — AB (ref 11.0–14.6)
WBC: 10.2 10*3/uL (ref 4.0–10.3)
lymph#: 0.7 10*3/uL — ABNORMAL LOW (ref 0.9–3.3)

## 2014-12-10 NOTE — Patient Instructions (Signed)
Continue labs and chemotherapy as scheduled Follow up in 2 weeks, prior to the start of your next scheduled cycle of chemotherapy

## 2014-12-14 NOTE — Telephone Encounter (Signed)
Received call from patient today reporting severe stabbing left upper to middle back pain. Reports Norco 5/325 "isn't touching this pain." Explains the pain keeps him up at night. Denies headache, dizziness, nausea or vomiting. Reports he has reached out to Dr. Worthy Flank office "several times but, hasn't heard back." Patient scheduled for chemo and blood work on Thursday. Patient questions if Dr. Tammi Klippel would prescribe a strong pain medication to get him thru until his appointment on Thursday.

## 2014-12-14 NOTE — Telephone Encounter (Signed)
He has a left paraspinal metastasis, that we discussed treating.  But, at that time, he did not admit to pain there.  I can prescribe pain med and bring him back to irradiate metastasis.

## 2014-12-15 ENCOUNTER — Encounter (HOSPITAL_COMMUNITY): Payer: Self-pay

## 2014-12-16 ENCOUNTER — Telehealth: Payer: Self-pay | Admitting: Internal Medicine

## 2014-12-16 ENCOUNTER — Telehealth: Payer: Self-pay | Admitting: Radiation Oncology

## 2014-12-16 NOTE — Telephone Encounter (Signed)
pt called to r/s lab done....pt ok and aware of d.t

## 2014-12-16 NOTE — Telephone Encounter (Signed)
Phoned patient this morning. He confirmed the pain persistent. He states, "I have even bought a new recliner with more cushion hoping it will help me to get comfortable so I can sleep." Questioned if he had considered radiation treatment to his left paraspinal metastasis. Patient states, "I think it time to treat it." Patient reports that of his own accord he began taking Decadron 4 mg one tablet at night. He reports the decadron "helps a little." Provided patient with CT/Simulation appointment for 12/25/2014 at  1300. Patient confirmed he would present for the appointment. Patient questioned if Dr. Tammi Klippel would prescribe a different pain medication because the norco "just isn't working." Explained this RN will send his request to Dr. Tammi Klippel and call him back with his response. Patient verbalized understanding and expressed appreciation for the call.

## 2014-12-16 NOTE — Telephone Encounter (Signed)
Opened in error

## 2014-12-17 ENCOUNTER — Other Ambulatory Visit (HOSPITAL_BASED_OUTPATIENT_CLINIC_OR_DEPARTMENT_OTHER): Payer: PRIVATE HEALTH INSURANCE

## 2014-12-17 ENCOUNTER — Telehealth: Payer: Self-pay | Admitting: Internal Medicine

## 2014-12-17 ENCOUNTER — Other Ambulatory Visit: Payer: Self-pay | Admitting: Radiation Oncology

## 2014-12-17 ENCOUNTER — Ambulatory Visit (HOSPITAL_BASED_OUTPATIENT_CLINIC_OR_DEPARTMENT_OTHER): Payer: PRIVATE HEALTH INSURANCE | Admitting: Physician Assistant

## 2014-12-17 ENCOUNTER — Encounter: Payer: Self-pay | Admitting: Physician Assistant

## 2014-12-17 ENCOUNTER — Ambulatory Visit (HOSPITAL_BASED_OUTPATIENT_CLINIC_OR_DEPARTMENT_OTHER): Payer: PRIVATE HEALTH INSURANCE

## 2014-12-17 VITALS — BP 146/83 | HR 73 | Temp 98.3°F | Resp 19 | Ht 70.0 in | Wt 155.7 lb

## 2014-12-17 DIAGNOSIS — Z5111 Encounter for antineoplastic chemotherapy: Secondary | ICD-10-CM

## 2014-12-17 DIAGNOSIS — C3491 Malignant neoplasm of unspecified part of right bronchus or lung: Secondary | ICD-10-CM

## 2014-12-17 DIAGNOSIS — C7931 Secondary malignant neoplasm of brain: Secondary | ICD-10-CM

## 2014-12-17 DIAGNOSIS — C3411 Malignant neoplasm of upper lobe, right bronchus or lung: Secondary | ICD-10-CM

## 2014-12-17 LAB — COMPREHENSIVE METABOLIC PANEL (CC13)
ALK PHOS: 57 U/L (ref 40–150)
ALT: 16 U/L (ref 0–55)
ANION GAP: 12 meq/L — AB (ref 3–11)
AST: 8 U/L (ref 5–34)
Albumin: 3.2 g/dL — ABNORMAL LOW (ref 3.5–5.0)
BILIRUBIN TOTAL: 0.21 mg/dL (ref 0.20–1.20)
BUN: 17 mg/dL (ref 7.0–26.0)
CHLORIDE: 101 meq/L (ref 98–109)
CO2: 22 meq/L (ref 22–29)
CREATININE: 0.7 mg/dL (ref 0.7–1.3)
Calcium: 8.6 mg/dL (ref 8.4–10.4)
GLUCOSE: 83 mg/dL (ref 70–140)
POTASSIUM: 3.9 meq/L (ref 3.5–5.1)
Sodium: 135 mEq/L — ABNORMAL LOW (ref 136–145)
Total Protein: 6.3 g/dL — ABNORMAL LOW (ref 6.4–8.3)

## 2014-12-17 LAB — CBC WITH DIFFERENTIAL/PLATELET
BASO%: 0.2 % (ref 0.0–2.0)
BASOS ABS: 0 10*3/uL (ref 0.0–0.1)
EOS%: 0.3 % (ref 0.0–7.0)
Eosinophils Absolute: 0 10*3/uL (ref 0.0–0.5)
HCT: 40.3 % (ref 38.4–49.9)
HEMOGLOBIN: 14.4 g/dL (ref 13.0–17.1)
LYMPH#: 1.2 10*3/uL (ref 0.9–3.3)
LYMPH%: 9.7 % — ABNORMAL LOW (ref 14.0–49.0)
MCH: 32.3 pg (ref 27.2–33.4)
MCHC: 35.7 g/dL (ref 32.0–36.0)
MCV: 90.4 fL (ref 79.3–98.0)
MONO#: 1.4 10*3/uL — AB (ref 0.1–0.9)
MONO%: 10.9 % (ref 0.0–14.0)
NEUT%: 78.9 % — ABNORMAL HIGH (ref 39.0–75.0)
NEUTROS ABS: 9.8 10*3/uL — AB (ref 1.5–6.5)
Platelets: 358 10*3/uL (ref 140–400)
RBC: 4.46 10*6/uL (ref 4.20–5.82)
RDW: 14.4 % (ref 11.0–14.6)
WBC: 12.4 10*3/uL — AB (ref 4.0–10.3)

## 2014-12-17 MED ORDER — ONDANSETRON 16 MG/50ML IVPB (CHCC)
16.0000 mg | Freq: Once | INTRAVENOUS | Status: AC
  Administered 2014-12-17: 16 mg via INTRAVENOUS

## 2014-12-17 MED ORDER — CARBOPLATIN CHEMO INJECTION 600 MG/60ML
674.0000 mg | Freq: Once | INTRAVENOUS | Status: AC
  Administered 2014-12-17: 670 mg via INTRAVENOUS
  Filled 2014-12-17: qty 67

## 2014-12-17 MED ORDER — PROCHLORPERAZINE MALEATE 10 MG PO TABS
10.0000 mg | ORAL_TABLET | Freq: Four times a day (QID) | ORAL | Status: DC | PRN
Start: 2014-12-17 — End: 2014-12-31

## 2014-12-17 MED ORDER — ONDANSETRON 16 MG/50ML IVPB (CHCC)
INTRAVENOUS | Status: AC
  Filled 2014-12-17: qty 16

## 2014-12-17 MED ORDER — DEXAMETHASONE SODIUM PHOSPHATE 20 MG/5ML IJ SOLN
INTRAMUSCULAR | Status: AC
  Filled 2014-12-17: qty 5

## 2014-12-17 MED ORDER — DEXAMETHASONE 4 MG PO TABS: 4.0000 mg | ORAL_TABLET | Freq: Two times a day (BID) | ORAL | Status: AC

## 2014-12-17 MED ORDER — DEXAMETHASONE SODIUM PHOSPHATE 20 MG/5ML IJ SOLN
20.0000 mg | Freq: Once | INTRAMUSCULAR | Status: AC
  Administered 2014-12-17: 20 mg via INTRAVENOUS

## 2014-12-17 MED ORDER — SODIUM CHLORIDE 0.9 % IV SOLN
500.0000 mg/m2 | Freq: Once | INTRAVENOUS | Status: AC
  Administered 2014-12-17: 950 mg via INTRAVENOUS
  Filled 2014-12-17: qty 38

## 2014-12-17 MED ORDER — OXYCODONE HCL 5 MG PO CAPS: 5.0000 mg | ORAL_CAPSULE | ORAL | Status: DC | PRN

## 2014-12-17 MED ORDER — SODIUM CHLORIDE 0.9 % IV SOLN
Freq: Once | INTRAVENOUS | Status: AC
  Administered 2014-12-17: 13:00:00 via INTRAVENOUS

## 2014-12-17 NOTE — Patient Instructions (Addendum)
[facility] Discharge Instructions for Patients Receiving Chemotherapy  Today you received the following chemotherapy agents Alimta/Carboplatin To help prevent nausea and vomiting after your treatment, we encourage you to take your nausea medication: Compazine. Take one every six hours as needed. If you develop nausea and vomiting that is not controlled by your nausea medication, call the clinic.   BELOW ARE SYMPTOMS THAT SHOULD BE REPORTED IMMEDIATELY:  *FEVER GREATER THAN 100.5 F  *CHILLS WITH OR WITHOUT FEVER  NAUSEA AND VOMITING THAT IS NOT CONTROLLED WITH YOUR NAUSEA MEDICATION  *UNUSUAL SHORTNESS OF BREATH  *UNUSUAL BRUISING OR BLEEDING  TENDERNESS IN MOUTH AND THROAT WITH OR WITHOUT PRESENCE OF ULCERS  *URINARY PROBLEMS  *BOWEL PROBLEMS  UNUSUAL RASH Items with * indicate a potential emergency and should be followed up as soon as possible.  Feel free to call the clinic should you have any questions or concerns. The clinic phone number is [phone].

## 2014-12-17 NOTE — Telephone Encounter (Signed)
lvm for pt regarding to March adn April appt.Marland KitchenMarland KitchenMarland Kitchen

## 2014-12-17 NOTE — Progress Notes (Signed)
[facility] Telephone:([phone]) [phone]   Fax:(336) [phone]  OFFICE PROGRESS NOTE  No PCP Per Patient No address on file  DIAGNOSIS: Stage IV (T2a, N0, M1b) non-small cell lung cancer, adenocarcinoma, with negative EGFR mutation and negative ALK gene translocation presented with large right upper lobe lung mass in addition to innumerable brain metastasis and questionable adrenal and bone metastasis diagnosed in January 2016.  PRIOR THERAPY: Whole brain irradiation under the care of Dr. Tammi Klippel. Completed 11/24/2014  CURRENT THERAPY: Systemic chemotherapy with carboplatin for an ECF 5 and Alimta at 500 mg/m given every 3 weeks. Status post 1 cycle  INTERVAL HISTORY: Jason Keller 54 y.o. male returns to the clinic today for a symptom management  accompanied by his wife. He tolerated his first cycle of chemotherapy with carboplatin and Alimta relatively well. He complains of some issues with constipation and over-the-counter stool softeners are not working for him. He also complains of back pain in the lower left portion of his back. He denies any numbness or tingling into his lower extremities or any loss of bowel or bladder function. He is scheduled to undergo CT simulation on 12/25/2014 to the paraspinal area under the care of Dr. Tammi Klippel with a follow-up appointment with Dr. Tammi Klippel on 12/31/2014. Currently radiation oncology is managing Jason Keller pain medications. He reports that the hydrocodone is not working. Dr. Johny Shears office will take care of changing his current pain medication while he is under his care. The patient denied having any significant chest pain, shortness breath,  or hemoptysis. He denied having any significant weight loss or night sweats. He has no nausea or vomiting, no fever or chills.   MEDICAL HISTORY: Past Medical History  Diagnosis Date  . Vertigo   . 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  Brain cancer     Invasive poorly differentiated adenocarcinoma RUL with 12 brain mets    ALLERGIES:  is allergic to penicillins.  MEDICATIONS:  Current Outpatient Prescriptions  Medication Sig Dispense Refill  . famotidine (PEPCID) 20 MG tablet One at bedtime 30 tablet 2  . folic acid (FOLVITE) 1 MG tablet Take 1 tablet (1 mg total) by mouth daily. 30 tablet 4  . meclizine (ANTIVERT) 12.5 MG tablet Take 12.5 mg by mouth 2 (two) times daily as needed for dizziness.    . pantoprazole (PROTONIX) 40 MG tablet Take 1 tablet (40 mg total) by mouth daily. Take 30-60 min before first meal of the day 30 tablet 2  . prochlorperazine (COMPAZINE) 10 MG tablet Take 1 tablet (10 mg total) by mouth every 6 (six) hours as needed for nausea or vomiting. 60 tablet 0  . dexamethasone (DECADRON) 4 MG tablet Take 1 tablet (4 mg total) by mouth 2 (two) times daily. 60 tablet 1  . emollient (BIAFINE) cream Apply topically as needed.    Marland Kitchen oxycodone (OXY-IR) 5 MG capsule Take 1 capsule (5 mg total) by mouth every 4 (four) hours as needed. 60 capsule 0   No current facility-administered medications for this visit.   Facility-Administered Medications Ordered in Other Visits  Medication Dose Route Frequency Provider Last Rate Last Dose  . topical emolient (BIAFINE) emulsion   Topical Daily [name], MD        SURGICAL HISTORY:  Past Surgical History  Procedure Laterality Date  . Video bronchoscopy Bilateral 10/26/2014    Procedure: VIDEO BRONCHOSCOPY WITHOUT FLUORO;  Surgeon: Nyoka Cowden, MD;  Location: [facility];  Service: Cardiopulmonary;  Laterality: Bilateral;    REVIEW OF SYSTEMS:  Constitutional: negative Eyes: negative Ears, nose, mouth, throat, and face: negative Respiratory: negative Cardiovascular: negative Gastrointestinal: negative Genitourinary:negative Integument/breast: negative Hematologic/lymphatic: negative Musculoskeletal:negative Neurological:  negative Behavioral/Psych: negative Endocrine: negative Allergic/Immunologic: negative   PHYSICAL EXAMINATION: General appearance: alert, cooperative and no distress Head: Normocephalic, without obvious abnormality, atraumatic Neck: no adenopathy, no JVD, supple, symmetrical, trachea midline and thyroid not enlarged, symmetric, no tenderness/mass/nodules Lymph nodes: Cervical, supraclavicular, and axillary nodes normal. Resp: clear to auscultation bilaterally Back: symmetric, no curvature. ROM normal. No CVA tenderness. Cardio: regular rate and rhythm, S1, S2 normal, no murmur, click, rub or gallop GI: soft, non-tender; bowel sounds normal; no masses,  no organomegaly Extremities: extremities normal, atraumatic, no cyanosis or edema Neurologic: Alert and oriented X 3, normal strength and tone. Normal symmetric reflexes. Normal coordination and gait  ECOG PERFORMANCE STATUS: 1 - Symptomatic but completely ambulatory  Blood pressure 146/83, pulse 73, temperature 98.3 F (36.8 C), temperature source Oral, resp. rate 19, height 5\' 10"  (1.778 m), weight 155 lb 11.2 oz (70.625 kg), SpO2 100 %.  LABORATORY DATA: Lab Results  Component Value Date   WBC 12.4* 12/17/2014   HGB 14.4 12/17/2014   HCT 40.3 12/17/2014   MCV 90.4 12/17/2014   PLT 358 12/17/2014      Chemistry      Component Value Date/Time   NA 135* 12/17/2014 1157   NA 138 10/08/2014 2004   K 3.9 12/17/2014 1157   K 3.9 10/08/2014 2004   CL 106 10/08/2014 2004   CO2 22 12/17/2014 1157   CO2 21 10/08/2014 2004   BUN 17.0 12/17/2014 1157   BUN 11 10/08/2014 2004   CREATININE 0.7 12/17/2014 1157   CREATININE 0.74 10/08/2014 2004      Component Value Date/Time   CALCIUM 8.6 12/17/2014 1157   CALCIUM 9.2 10/08/2014 2004   ALKPHOS 57 12/17/2014 1157   ALKPHOS 44 10/08/2014 2004   AST 8 12/17/2014 1157   AST 18 10/08/2014 2004   ALT 16 12/17/2014 1157   ALT 16 10/08/2014 2004   BILITOT 0.21 12/17/2014 1157    BILITOT 0.4 10/08/2014 2004       RADIOGRAPHIC STUDIES: No results found.  ASSESSMENT AND PLAN: This is a very pleasant 54 years old white male recently diagnosed with a stage IV non-small cell lung cancer, adenocarcinoma with negative EGFR mutation and negative ALK gene translocation.  He is currently undergoing whole brain irradiation for the multiple brain metastasis. He is completed his course of whole brain radiation and the care Dr. 44. He is now status post one cycle of systemic chemotherapy with carboplatin for an before meals of 5 and Alimta 500 mg/m given every 3 weeks. Patient was discussed with  Dr. Kathrynn Running. He'll proceed with cycle #2 of his systemic chemotherapy with carboplatin and Alimta today as scheduled. He will continue with weekly labs as previously scheduled. For constipation patient was advised to change to mural lacks in the mornings and Senokot-S in the evenings to better control his constipation. As stated above his pain medication is currently being prescribed by Dr. Arbutus Ped and he will make any necessary changes to the patient's pain medicine. He'll follow-up with his simulation and palliative radiation therapy under the care of Dr. Kathrynn Running as scheduled. He will  return in 3 weeks for another symptom management visit prior to the start of cycle #3.  He was advised to call immediately if he has any concerning symptoms in the interval. The patient voices understanding of current disease status and treatment options and is in agreement with the current care plan.  All questions were answered. The patient knows to call the clinic with any problems, questions or concerns. We can certainly see the patient much sooner if necessary.  Carlton Adam, PA-C 12/17/2014

## 2014-12-20 NOTE — Patient Instructions (Signed)
Keep your appointments with Dr. Tammi Klippel regarding radiation therapy for your back pain Continue weekly labs as scheduled Follow up in 3 weeks

## 2014-12-21 ENCOUNTER — Telehealth: Payer: Self-pay | Admitting: Medical Oncology

## 2014-12-21 ENCOUNTER — Ambulatory Visit
Admission: RE | Admit: 2014-12-21 | Discharge: 2014-12-21 | Disposition: A | Discharge status: Home / Self Care | Payer: PRIVATE HEALTH INSURANCE | Source: Ambulatory Visit | Attending: Radiation Oncology | Admitting: Radiation Oncology

## 2014-12-21 VITALS — BP 125/86 | HR 81 | Temp 98.3°F | Resp 20 | Wt 153.5 lb

## 2014-12-21 DIAGNOSIS — Z510 Encounter for antineoplastic radiation therapy: Secondary | ICD-10-CM | POA: Insufficient documentation

## 2014-12-21 DIAGNOSIS — R109 Unspecified abdominal pain: Secondary | ICD-10-CM | POA: Diagnosis not present

## 2014-12-21 DIAGNOSIS — C7931 Secondary malignant neoplasm of brain: Secondary | ICD-10-CM | POA: Insufficient documentation

## 2014-12-21 DIAGNOSIS — C3411 Malignant neoplasm of upper lobe, right bronchus or lung: Secondary | ICD-10-CM | POA: Insufficient documentation

## 2014-12-21 DIAGNOSIS — Z7952 Long term (current) use of systemic steroids: Secondary | ICD-10-CM | POA: Insufficient documentation

## 2014-12-21 DIAGNOSIS — T402X5S Adverse effect of other opioids, sequela: Secondary | ICD-10-CM | POA: Diagnosis not present

## 2014-12-21 DIAGNOSIS — Z79891 Long term (current) use of opiate analgesic: Secondary | ICD-10-CM | POA: Diagnosis not present

## 2014-12-21 DIAGNOSIS — C7951 Secondary malignant neoplasm of bone: Secondary | ICD-10-CM | POA: Diagnosis not present

## 2014-12-21 DIAGNOSIS — C3491 Malignant neoplasm of unspecified part of right bronchus or lung: Secondary | ICD-10-CM | POA: Diagnosis not present

## 2014-12-21 DIAGNOSIS — C7972 Secondary malignant neoplasm of left adrenal gland: Secondary | ICD-10-CM | POA: Diagnosis not present

## 2014-12-21 DIAGNOSIS — G893 Neoplasm related pain (acute) (chronic): Secondary | ICD-10-CM | POA: Diagnosis not present

## 2014-12-21 DIAGNOSIS — T402X5A Adverse effect of other opioids, initial encounter: Secondary | ICD-10-CM | POA: Diagnosis not present

## 2014-12-21 DIAGNOSIS — L270 Generalized skin eruption due to drugs and medicaments taken internally: Secondary | ICD-10-CM | POA: Diagnosis not present

## 2014-12-21 DIAGNOSIS — Z79899 Other long term (current) drug therapy: Secondary | ICD-10-CM | POA: Insufficient documentation

## 2014-12-21 MED ORDER — HYDROCODONE-ACETAMINOPHEN 5-325 MG PO TABS: 1.0000 | ORAL_TABLET | Freq: Four times a day (QID) | ORAL | Status: DC | PRN

## 2014-12-21 MED ORDER — HYDROCODONE-ACETAMINOPHEN 5-325 MG PO TABS
2.0000 | ORAL_TABLET | Freq: Once | ORAL | Status: AC
  Administered 2014-12-21: 2 via ORAL
  Filled 2014-12-21: qty 2

## 2014-12-21 NOTE — Progress Notes (Signed)
  Radiation Oncology         [phone] ________________________________  Name: ADEKUNLE ROHRBACH MRN: [id number]  Date: 12/21/2014  DOB: Feb 02, 1961  SIMULATION AND TREATMENT PLANNING NOTE    ICD-9-CM ICD-10-CM   1. Non-small cell cancer of right lung 162.9 C34.91     DIAGNOSIS:  54 year old with painful flank metastasis and adrenal metastasis on the left side from  NARRATIVE:  The patient was brought to the [facility].  Identity was confirmed.  All relevant records and images related to the planned course of therapy were reviewed.  The patient freely provided informed written consent to proceed with treatment after reviewing the details related to the planned course of therapy. The consent form was witnessed and verified by the simulation staff.  Then, the patient was set-up in a stable reproducible  supine position for radiation therapy.  CT images were obtained.  Surface markings were placed.  The CT images were loaded into the planning software.  Then the target and avoidance structures were contoured.  Treatment planning then occurred.  The radiation prescription was entered and confirmed.  Then, I designed and supervised the construction of a total of 3 medically necessary complex treatment devices in the form of multileaf collimators to shape radiation around the targeted areas will shielding the critical kidneys liver and small bowel maximally..  I have requested : 3D Simulation  I have requested a DVH of the following structures: The left kidney, right kidney, the left flank target, and the left adrenal target.  I have ordered:Nutrition Consult  PLAN:  The patient will receive 30 Gy in 10 fraction.  ________________________________  Sheral Apley Tammi Klippel, M.D.

## 2014-12-21 NOTE — Progress Notes (Signed)
Patient presented for simulation. Reports pain in back is to severe to lay flat on CT table. Per Dr. Johny Shears order administered norco 5/325 two tablets by mouth.

## 2014-12-21 NOTE — Progress Notes (Signed)
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 Non-small cell cancer of right lung 162.9 C34.91 HYDROcodone-acetaminophen (NORCO/VICODIN) 5-325 MG per tablet    Interval Since Last Radiation:  4  weeks  Narrative:  The patient returns today for routine follow-up.  Patient here for assessment of rash which developed 24 hours within starting oxycodone for pain on Friday 12/18/14.Red non-itching raised rash is on anterior and posterior torso.Pain level now is "4' of posterior back.Would like refill on hydrocodone and folic acid.Will add oxycodone to allergies.Reviewed appointment schedule for ct sim on 12/25/14 and chemotherapy 01/07/15.                              ALLERGIES:  is allergic to oxycodone and penicillins.  Meds: Current Outpatient Prescriptions  Medication Sig Dispense Refill  . dexamethasone (DECADRON) 4 MG tablet Take 1 tablet (4 mg total) by mouth 2 (two) times daily. 60 tablet 1  . docusate sodium (COLACE) 100 MG capsule Take 100 mg by mouth daily. 2 once day    . famotidine (PEPCID) 20 MG tablet One at bedtime 30 tablet 2  . folic acid (FOLVITE) 1 MG tablet Take 1 tablet (1 mg total) by mouth daily. 30 tablet 4  . pantoprazole (PROTONIX) 40 MG tablet Take 1 tablet (40 mg total) by mouth daily. Take 30-60 min before first meal of the day 30 tablet 2  . polyethylene glycol (MIRALAX / GLYCOLAX) packet Take 17 g by mouth daily.    . prochlorperazine (COMPAZINE) 10 MG tablet Take 1 tablet (10 mg total) by mouth every 6 (six) hours as needed for nausea or vomiting. 60 tablet 0  . emollient (BIAFINE) cream Apply topically as needed.    Marland Kitchen  HYDROcodone-acetaminophen (NORCO/VICODIN) 5-325 MG per tablet Take 1-2 tablets by mouth every 6 (six) hours as needed for moderate pain. 120 tablet 0  . meclizine (ANTIVERT) 12.5 MG tablet Take 12.5 mg by mouth 2 (two) times daily as needed for dizziness.    Marland Kitchen oxycodone (OXY-IR) 5 MG capsule Take 1 capsule (5 mg total) by mouth every 4 (four) hours as needed. 60 capsule 0   No current facility-administered medications for this encounter.   Facility-Administered Medications Ordered in Other Encounters  Medication Dose Route Frequency Provider Last Rate Last Dose  . topical emolient (BIAFINE) emulsion   Topical Daily Tyler Pita, MD        Physical Findings: The patient is in no acute distress. Patient is alert and oriented.  weight is 153 lb 8 oz (69.627 kg). His temperature is 98.3 F (36.8 C). His blood pressure is 125/86 and his pulse is 81. His respiration is 20 and oxygen saturation is 98%. .  No significant changes.  Lab Findings: Lab Results  Component Value Date   WBC 12.4* 12/17/2014   WBC 6.6 10/08/2014   HGB 14.4 12/17/2014   HGB 14.3 10/08/2014   HCT 40.3 12/17/2014   HCT 41.0 10/08/2014   PLT 358 12/17/2014   PLT 208 10/08/2014    Lab Results  Component Value Date  NA 135* 12/17/2014   NA 138 10/08/2014   K 3.9 12/17/2014   K 3.9 10/08/2014   CHLORIDE 101 12/17/2014   CO2 22 12/17/2014   CO2 21 10/08/2014   GLUCOSE 83 12/17/2014   GLUCOSE 90 10/08/2014   BUN 17.0 12/17/2014   BUN 11 10/08/2014   CREATININE 0.7 12/17/2014   CREATININE 0.74 10/08/2014   BILITOT 0.21 12/17/2014   BILITOT 0.4 10/08/2014   ALKPHOS 57 12/17/2014   ALKPHOS 44 10/08/2014   AST 8 12/17/2014   AST 18 10/08/2014   ALT 16 12/17/2014   ALT 16 10/08/2014   PROT 6.3* 12/17/2014   PROT 6.5 10/08/2014   ALBUMIN 3.2* 12/17/2014   ALBUMIN 3.9 10/08/2014   CALCIUM 8.6 12/17/2014   CALCIUM 9.2 10/08/2014   ANIONGAP 12* 12/17/2014   ANIONGAP 11 10/08/2014   Impression:  The  patient has been recovering from the effects of radiation.  He developed a drug-related rash related to oxycodone. The patient has a known metastatic deposit within the left paraspinal muscles, or left flank could be contributing to his left flank pain. He also has a large left adrenal metastasis which may also be a contributor. In the past, during our discussions, the patient denied any significant pain. However, this has changed and worsened.  Plan:  Today, I switched the patient back from oxycodone to hydrocodone. I also talked to him about palliative radiation to the left flank for pain control. We talked about the logistics and delivery of treatment as well as the anticipated acute and late sequela. The patient would like to proceed with palliative radiation to the left flank incorporating the paraspinal soft tissue mass as well as the left adrenal gland.  _____________________________________  Sheral Apley Tammi Klippel, M.D.

## 2014-12-21 NOTE — Telephone Encounter (Signed)
erroneous

## 2014-12-21 NOTE — Progress Notes (Signed)
Patient here for assessment of rash which developed 24 hours within starting oxycodone for pain on Friday 12/18/14.Red non-itching raised rash is on anterior and posterior torso.Pain level now is "4' of posterior back.Would like refill on hydrocodone and folic acid.Will add oxycodone to allergies.Reviewed appointment schedule for ct sim on 12/25/14 and chemotherapy 01/07/15.

## 2014-12-21 NOTE — Addendum Note (Signed)
Encounter addended by: Heywood Footman, RN on: 12/21/2014  2:27 PM<BR>     Documentation filed: Notes Section, Problem List, Visit Diagnoses, Orders, Dx Association, Inpatient [facility]

## 2014-12-21 NOTE — Progress Notes (Signed)
Simulation complete. Patient tolerated it well. Patient confirms pain is less following norco tablets.

## 2014-12-21 NOTE — Addendum Note (Signed)
Encounter addended by: Heywood Footman, RN on: 12/21/2014  3:19 PM<BR>     Documentation filed: Notes Section

## 2014-12-23 DIAGNOSIS — Z510 Encounter for antineoplastic radiation therapy: Secondary | ICD-10-CM | POA: Diagnosis not present

## 2014-12-24 ENCOUNTER — Ambulatory Visit
Admission: RE | Admit: 2014-12-24 | Discharge: 2014-12-24 | Disposition: A | Discharge status: Home / Self Care | Payer: PRIVATE HEALTH INSURANCE | Source: Ambulatory Visit | Attending: Radiation Oncology | Admitting: Radiation Oncology

## 2014-12-24 ENCOUNTER — Other Ambulatory Visit: Payer: PRIVATE HEALTH INSURANCE

## 2014-12-24 DIAGNOSIS — C7931 Secondary malignant neoplasm of brain: Secondary | ICD-10-CM

## 2014-12-24 DIAGNOSIS — Z510 Encounter for antineoplastic radiation therapy: Secondary | ICD-10-CM | POA: Diagnosis not present

## 2014-12-25 ENCOUNTER — Ambulatory Visit
Admission: RE | Admit: 2014-12-25 | Discharge: 2014-12-25 | Disposition: A | Discharge status: Home / Self Care | Payer: PRIVATE HEALTH INSURANCE | Source: Ambulatory Visit | Attending: Radiation Oncology | Admitting: Radiation Oncology

## 2014-12-25 ENCOUNTER — Other Ambulatory Visit (HOSPITAL_BASED_OUTPATIENT_CLINIC_OR_DEPARTMENT_OTHER): Payer: PRIVATE HEALTH INSURANCE

## 2014-12-25 ENCOUNTER — Ambulatory Visit: Payer: PRIVATE HEALTH INSURANCE | Admitting: Radiation Oncology

## 2014-12-25 VITALS — BP 129/83 | HR 84 | Temp 98.7°F | Resp 20 | Wt 150.1 lb

## 2014-12-25 DIAGNOSIS — C3491 Malignant neoplasm of unspecified part of right bronchus or lung: Secondary | ICD-10-CM

## 2014-12-25 DIAGNOSIS — C7931 Secondary malignant neoplasm of brain: Secondary | ICD-10-CM

## 2014-12-25 DIAGNOSIS — C3411 Malignant neoplasm of upper lobe, right bronchus or lung: Secondary | ICD-10-CM

## 2014-12-25 DIAGNOSIS — Z510 Encounter for antineoplastic radiation therapy: Secondary | ICD-10-CM | POA: Diagnosis not present

## 2014-12-25 LAB — CBC WITH DIFFERENTIAL/PLATELET
BASO%: 0 % (ref 0.0–2.0)
Basophils Absolute: 0 10*3/uL (ref 0.0–0.1)
EOS%: 0.1 % (ref 0.0–7.0)
Eosinophils Absolute: 0 10*3/uL (ref 0.0–0.5)
HEMATOCRIT: 37.9 % — AB (ref 38.4–49.9)
HGB: 13.5 g/dL (ref 13.0–17.1)
LYMPH#: 0.6 10*3/uL — AB (ref 0.9–3.3)
LYMPH%: 5.9 % — AB (ref 14.0–49.0)
MCH: 32.1 pg (ref 27.2–33.4)
MCHC: 35.6 g/dL (ref 32.0–36.0)
MCV: 90 fL (ref 79.3–98.0)
MONO#: 0.4 10*3/uL (ref 0.1–0.9)
MONO%: 4.6 % (ref 0.0–14.0)
NEUT#: 8.3 10*3/uL — ABNORMAL HIGH (ref 1.5–6.5)
NEUT%: 89.4 % — AB (ref 39.0–75.0)
PLATELETS: 195 10*3/uL (ref 140–400)
RBC: 4.21 10*6/uL (ref 4.20–5.82)
RDW: 14.1 % (ref 11.0–14.6)
WBC: 9.3 10*3/uL (ref 4.0–10.3)

## 2014-12-25 LAB — COMPREHENSIVE METABOLIC PANEL (CC13)
ALT: 35 U/L (ref 0–55)
AST: 13 U/L (ref 5–34)
Albumin: 3.3 g/dL — ABNORMAL LOW (ref 3.5–5.0)
Alkaline Phosphatase: 53 U/L (ref 40–150)
Anion Gap: 12 mEq/L — ABNORMAL HIGH (ref 3–11)
BILIRUBIN TOTAL: 0.36 mg/dL (ref 0.20–1.20)
BUN: 11.2 mg/dL (ref 7.0–26.0)
CALCIUM: 9 mg/dL (ref 8.4–10.4)
CHLORIDE: 100 meq/L (ref 98–109)
CO2: 23 mEq/L (ref 22–29)
CREATININE: 0.7 mg/dL (ref 0.7–1.3)
Glucose: 111 mg/dl (ref 70–140)
Potassium: 4.1 mEq/L (ref 3.5–5.1)
Sodium: 134 mEq/L — ABNORMAL LOW (ref 136–145)
Total Protein: 6.2 g/dL — ABNORMAL LOW (ref 6.4–8.3)

## 2014-12-25 NOTE — Progress Notes (Signed)
  Radiation Oncology         [phone] ________________________________  Name: Jason Keller MRN: [id number]  Date: 12/25/2014  DOB: [birth year]  Weekly Radiation Therapy Management    ICD-9-CM ICD-10-CM   1. Brain metastases 198.3 C79.31     Current Dose: 6 Gy     Planned Dose:  30 Gy  Narrative . . . . . . . . The patient presents for routine under treatment assessment.                                   The patient is without complaint.                                 Set-up films were reviewed.                                 The chart was checked. Physical Findings. . .  weight is 150 lb 1.6 oz (68.085 kg). His oral temperature is 98.7 F (37.1 C). His blood pressure is 129/83 and his pulse is 84. His respiration is 20. . Weight essentially stable.  No significant changes. Impression . . . . . . . The patient is tolerating radiation. Plan . . . . . . . . . . . . Continue treatment as planned.  ________________________________  Sheral Apley. Tammi Klippel, M.D.

## 2014-12-25 NOTE — Progress Notes (Signed)
Patient reports pain in his back 4/10. He takes Hydrocodone 1 1/2 tabs every 6 hours, also taking medication for nausea; states he stays nauseated. He has loss of appetite due to "foods not tasting good", fatigued but does do short activities. He denies other problems. Reviewed with patient and wife, possible temporary side effects of treatment re: fatigue, nausea, diarrhea. Teach back method used, pt and wife verbalized understanding. Pt has received radiation treatments in the past.

## 2014-12-28 ENCOUNTER — Ambulatory Visit
Admission: RE | Admit: 2014-12-28 | Discharge: 2014-12-28 | Disposition: A | Discharge status: Home / Self Care | Payer: PRIVATE HEALTH INSURANCE | Source: Ambulatory Visit | Attending: Radiation Oncology | Admitting: Radiation Oncology

## 2014-12-28 DIAGNOSIS — Z510 Encounter for antineoplastic radiation therapy: Secondary | ICD-10-CM | POA: Diagnosis not present

## 2014-12-29 ENCOUNTER — Ambulatory Visit
Admission: RE | Admit: 2014-12-29 | Discharge: 2014-12-29 | Disposition: A | Discharge status: Home / Self Care | Payer: PRIVATE HEALTH INSURANCE | Source: Ambulatory Visit | Attending: Radiation Oncology | Admitting: Radiation Oncology

## 2014-12-29 DIAGNOSIS — Z510 Encounter for antineoplastic radiation therapy: Secondary | ICD-10-CM | POA: Diagnosis not present

## 2014-12-30 ENCOUNTER — Other Ambulatory Visit: Payer: Self-pay

## 2014-12-30 ENCOUNTER — Ambulatory Visit
Admission: RE | Admit: 2014-12-30 | Discharge: 2014-12-30 | Disposition: A | Discharge status: Home / Self Care | Payer: PRIVATE HEALTH INSURANCE | Source: Ambulatory Visit | Attending: Radiation Oncology | Admitting: Radiation Oncology

## 2014-12-30 DIAGNOSIS — Z510 Encounter for antineoplastic radiation therapy: Secondary | ICD-10-CM | POA: Diagnosis not present

## 2014-12-30 NOTE — Telephone Encounter (Signed)
S/w pt that we received an electronic request for compazine refill. He does not need compazine but does need protonix. Called [facility] and he still has 2 refills left on his Rx. They will fill it and call pt when it is ready

## 2014-12-31 ENCOUNTER — Other Ambulatory Visit (HOSPITAL_BASED_OUTPATIENT_CLINIC_OR_DEPARTMENT_OTHER): Payer: PRIVATE HEALTH INSURANCE

## 2014-12-31 ENCOUNTER — Other Ambulatory Visit: Payer: Self-pay

## 2014-12-31 ENCOUNTER — Ambulatory Visit: Payer: PRIVATE HEALTH INSURANCE | Admitting: Radiation Oncology

## 2014-12-31 ENCOUNTER — Ambulatory Visit
Admission: RE | Admit: 2014-12-31 | Discharge: 2014-12-31 | Disposition: A | Discharge status: Home / Self Care | Payer: PRIVATE HEALTH INSURANCE | Source: Ambulatory Visit | Attending: Radiation Oncology | Admitting: Radiation Oncology

## 2014-12-31 VITALS — BP 139/80 | HR 75 | Temp 98.7°F | Resp 16

## 2014-12-31 DIAGNOSIS — C3491 Malignant neoplasm of unspecified part of right bronchus or lung: Secondary | ICD-10-CM

## 2014-12-31 DIAGNOSIS — Z510 Encounter for antineoplastic radiation therapy: Secondary | ICD-10-CM | POA: Diagnosis not present

## 2014-12-31 LAB — COMPREHENSIVE METABOLIC PANEL (CC13)
ALBUMIN: 3.4 g/dL — AB (ref 3.5–5.0)
ALT: 27 U/L (ref 0–55)
AST: 10 U/L (ref 5–34)
Alkaline Phosphatase: 55 U/L (ref 40–150)
Anion Gap: 11 mEq/L (ref 3–11)
BILIRUBIN TOTAL: 0.41 mg/dL (ref 0.20–1.20)
BUN: 14.5 mg/dL (ref 7.0–26.0)
CALCIUM: 9.2 mg/dL (ref 8.4–10.4)
CO2: 24 meq/L (ref 22–29)
CREATININE: 0.6 mg/dL — AB (ref 0.7–1.3)
Chloride: 100 mEq/L (ref 98–109)
GLUCOSE: 86 mg/dL (ref 70–140)
POTASSIUM: 3.8 meq/L (ref 3.5–5.1)
Sodium: 135 mEq/L — ABNORMAL LOW (ref 136–145)
Total Protein: 6.5 g/dL (ref 6.4–8.3)

## 2014-12-31 LAB — CBC WITH DIFFERENTIAL/PLATELET
BASO%: 0 % (ref 0.0–2.0)
BASOS ABS: 0 10*3/uL (ref 0.0–0.1)
EOS%: 0.5 % (ref 0.0–7.0)
Eosinophils Absolute: 0 10*3/uL (ref 0.0–0.5)
HCT: 40.1 % (ref 38.4–49.9)
HGB: 14.5 g/dL (ref 13.0–17.1)
LYMPH#: 0.7 10*3/uL — AB (ref 0.9–3.3)
LYMPH%: 8.1 % — AB (ref 14.0–49.0)
MCH: 33 pg (ref 27.2–33.4)
MCHC: 36.2 g/dL — AB (ref 32.0–36.0)
MCV: 91.3 fL (ref 79.3–98.0)
MONO#: 0.6 10*3/uL (ref 0.1–0.9)
MONO%: 6.5 % (ref 0.0–14.0)
NEUT%: 84.9 % — AB (ref 39.0–75.0)
NEUTROS ABS: 7.4 10*3/uL — AB (ref 1.5–6.5)
Platelets: 179 10*3/uL (ref 140–400)
RBC: 4.39 10*6/uL (ref 4.20–5.82)
RDW: 14.7 % — AB (ref 11.0–14.6)
WBC: 8.7 10*3/uL (ref 4.0–10.3)

## 2014-12-31 MED ORDER — PROCHLORPERAZINE MALEATE 10 MG PO TABS: 10.0000 mg | ORAL_TABLET | Freq: Four times a day (QID) | ORAL | Status: DC | PRN

## 2014-12-31 MED ORDER — MORPHINE SULFATE 15 MG PO TABS: 15.0000 mg | ORAL_TABLET | ORAL | Status: DC | PRN

## 2014-12-31 NOTE — Progress Notes (Signed)
Patient presented to the clinic accompanied by his wife following treatment. Patient request to be seen by a physician. Patient states, "I feel awful and I have for a week." Patient tapered down from Decadron 4 mg to 2 mg bid. Reports he has constant nausea despite taking compazine every six hours. Reports decreased appetite. Patient reports he is unable to sleep because of pain and restlessness. Reports taking vicodin 5/325 every six hours. Patient is not orthostatic but has lost 5 lb since last week.   Sitting 78, 146/87 Standing 89, 141/96

## 2014-12-31 NOTE — Progress Notes (Signed)
.    Radiation Oncology         [phone] ________________________________  Name: Jason Keller MRN: [id number]  Date: 12/31/2014  DOB: 1961-03-13  Weekly Radiation Therapy Management    ICD-9-CM ICD-10-CM   1. Non-small cell cancer of right lung 162.9 C34.91     Current Dose: 18 Gy     Planned Dose:  30 Gy  Narrative . . . . . . . . The patient presents for routine under treatment assessment.                                   Patient presented to the clinic accompanied by his wife following treatment. Patient request to be seen by a physician. Patient states, "I feel awful and I have for a week." Patient tapered down from Decadron 4 mg to 2 mg bid. Reports he has constant nausea despite taking compazine every six hours. Reports decreased appetite. Patient reports he is unable to sleep because of pain and restlessness. Reports taking vicodin 5/325 every six hours. Patient is not orthostatic but has lost 5 lb since last week.                                  Set-up films were reviewed.                                 The chart was checked. Physical Findings. . .  oral temperature is 98.7 F (37.1 C). His blood pressure is 139/80 and his pulse is 75. His respiration is 16 and oxygen saturation is 97%. . Weight essentially stable.  No significant changes. Impression . . . . . . . The patient is tolerating radiation. Plan . . . . . . . . . . . . Continue treatment as planned.  MSIR 15 mg given  ________________________________  Sheral Apley. Tammi Klippel, M.D.

## 2015-01-01 ENCOUNTER — Ambulatory Visit
Admission: RE | Admit: 2015-01-01 | Discharge: 2015-01-01 | Disposition: A | Discharge status: Home / Self Care | Payer: PRIVATE HEALTH INSURANCE | Source: Ambulatory Visit | Attending: Radiation Oncology | Admitting: Radiation Oncology

## 2015-01-01 ENCOUNTER — Encounter: Payer: Self-pay | Admitting: Radiation Oncology

## 2015-01-01 DIAGNOSIS — Z510 Encounter for antineoplastic radiation therapy: Secondary | ICD-10-CM | POA: Diagnosis not present

## 2015-01-04 ENCOUNTER — Ambulatory Visit
Admission: RE | Admit: 2015-01-04 | Discharge: 2015-01-04 | Disposition: A | Discharge status: Home / Self Care | Payer: PRIVATE HEALTH INSURANCE | Source: Ambulatory Visit | Attending: Radiation Oncology | Admitting: Radiation Oncology

## 2015-01-04 DIAGNOSIS — Z510 Encounter for antineoplastic radiation therapy: Secondary | ICD-10-CM | POA: Diagnosis not present

## 2015-01-05 ENCOUNTER — Ambulatory Visit
Admission: RE | Admit: 2015-01-05 | Discharge: 2015-01-05 | Disposition: A | Discharge status: Home / Self Care | Payer: PRIVATE HEALTH INSURANCE | Source: Ambulatory Visit | Attending: Radiation Oncology | Admitting: Radiation Oncology

## 2015-01-05 DIAGNOSIS — Z510 Encounter for antineoplastic radiation therapy: Secondary | ICD-10-CM | POA: Diagnosis not present

## 2015-01-06 ENCOUNTER — Ambulatory Visit
Admission: RE | Admit: 2015-01-06 | Discharge: 2015-01-06 | Disposition: A | Discharge status: Home / Self Care | Payer: PRIVATE HEALTH INSURANCE | Source: Ambulatory Visit | Attending: Radiation Oncology | Admitting: Radiation Oncology

## 2015-01-06 ENCOUNTER — Encounter: Payer: Self-pay | Admitting: Radiation Oncology

## 2015-01-06 ENCOUNTER — Other Ambulatory Visit: Payer: Self-pay | Admitting: Internal Medicine

## 2015-01-06 ENCOUNTER — Other Ambulatory Visit: Payer: Self-pay | Admitting: Physician Assistant

## 2015-01-06 DIAGNOSIS — Z510 Encounter for antineoplastic radiation therapy: Secondary | ICD-10-CM | POA: Diagnosis not present

## 2015-01-07 ENCOUNTER — Other Ambulatory Visit (HOSPITAL_BASED_OUTPATIENT_CLINIC_OR_DEPARTMENT_OTHER): Payer: PRIVATE HEALTH INSURANCE

## 2015-01-07 ENCOUNTER — Ambulatory Visit: Payer: PRIVATE HEALTH INSURANCE

## 2015-01-07 ENCOUNTER — Telehealth: Payer: Self-pay | Admitting: Radiation Oncology

## 2015-01-07 ENCOUNTER — Encounter: Payer: Self-pay | Admitting: Physician Assistant

## 2015-01-07 ENCOUNTER — Ambulatory Visit (HOSPITAL_BASED_OUTPATIENT_CLINIC_OR_DEPARTMENT_OTHER): Payer: PRIVATE HEALTH INSURANCE

## 2015-01-07 ENCOUNTER — Ambulatory Visit (HOSPITAL_BASED_OUTPATIENT_CLINIC_OR_DEPARTMENT_OTHER): Payer: PRIVATE HEALTH INSURANCE | Admitting: Physician Assistant

## 2015-01-07 VITALS — BP 145/84 | HR 87 | Temp 98.6°F | Resp 18 | Ht 70.0 in | Wt 142.5 lb

## 2015-01-07 DIAGNOSIS — Z5111 Encounter for antineoplastic chemotherapy: Secondary | ICD-10-CM | POA: Diagnosis not present

## 2015-01-07 DIAGNOSIS — C7931 Secondary malignant neoplasm of brain: Secondary | ICD-10-CM | POA: Diagnosis not present

## 2015-01-07 DIAGNOSIS — C3491 Malignant neoplasm of unspecified part of right bronchus or lung: Secondary | ICD-10-CM

## 2015-01-07 DIAGNOSIS — C3411 Malignant neoplasm of upper lobe, right bronchus or lung: Secondary | ICD-10-CM | POA: Diagnosis not present

## 2015-01-07 LAB — CBC WITH DIFFERENTIAL/PLATELET
BASO%: 0.2 % (ref 0.0–2.0)
BASOS ABS: 0 10*3/uL (ref 0.0–0.1)
EOS%: 0 % (ref 0.0–7.0)
Eosinophils Absolute: 0 10*3/uL (ref 0.0–0.5)
HEMATOCRIT: 40.4 % (ref 38.4–49.9)
HEMOGLOBIN: 13.6 g/dL (ref 13.0–17.1)
LYMPH%: 1.6 % — ABNORMAL LOW (ref 14.0–49.0)
MCH: 31.6 pg (ref 27.2–33.4)
MCHC: 33.7 g/dL (ref 32.0–36.0)
MCV: 93.7 fL (ref 79.3–98.0)
MONO#: 0.5 10*3/uL (ref 0.1–0.9)
MONO%: 6.3 % (ref 0.0–14.0)
NEUT#: 6.6 10*3/uL — ABNORMAL HIGH (ref 1.5–6.5)
NEUT%: 91.9 % — AB (ref 39.0–75.0)
Platelets: 217 10*3/uL (ref 140–400)
RBC: 4.31 10*6/uL (ref 4.20–5.82)
RDW: 15.9 % — ABNORMAL HIGH (ref 11.0–14.6)
WBC: 7.2 10*3/uL (ref 4.0–10.3)
lymph#: 0.1 10*3/uL — ABNORMAL LOW (ref 0.9–3.3)

## 2015-01-07 LAB — COMPREHENSIVE METABOLIC PANEL (CC13)
ALBUMIN: 3.5 g/dL (ref 3.5–5.0)
ALK PHOS: 65 U/L (ref 40–150)
ALT: 25 U/L (ref 0–55)
AST: 13 U/L (ref 5–34)
Anion Gap: 9 mEq/L (ref 3–11)
BUN: 13.6 mg/dL (ref 7.0–26.0)
CO2: 25 mEq/L (ref 22–29)
Calcium: 9.2 mg/dL (ref 8.4–10.4)
Chloride: 94 mEq/L — ABNORMAL LOW (ref 98–109)
Creatinine: 0.7 mg/dL (ref 0.7–1.3)
EGFR: >90 mL/min/{1.73_m2} (ref >90)
Glucose: 134 mg/dl (ref 70–140)
Potassium: 4.4 mEq/L (ref 3.5–5.1)
Sodium: 128 mEq/L — ABNORMAL LOW (ref 136–145)
Total Bilirubin: 0.5 mg/dL (ref 0.20–1.20)
Total Protein: 6.8 g/dL (ref 6.4–8.3)

## 2015-01-07 MED ORDER — SODIUM CHLORIDE 0.9 % IV SOLN
Freq: Once | INTRAVENOUS | Status: AC
  Administered 2015-01-07: 13:00:00 via INTRAVENOUS
  Filled 2015-01-07: qty 8

## 2015-01-07 MED ORDER — SODIUM CHLORIDE 0.9 % IV SOLN
674.0000 mg | Freq: Once | INTRAVENOUS | Status: AC
  Administered 2015-01-07: 670 mg via INTRAVENOUS
  Filled 2015-01-07: qty 67

## 2015-01-07 MED ORDER — SODIUM CHLORIDE 0.9 % IV SOLN
500.0000 mg/m2 | Freq: Once | INTRAVENOUS | Status: AC
  Administered 2015-01-07: 950 mg via INTRAVENOUS
  Filled 2015-01-07: qty 38

## 2015-01-07 MED ORDER — CYANOCOBALAMIN 1000 MCG/ML IJ SOLN
1000.0000 ug | Freq: Once | INTRAMUSCULAR | Status: AC
  Administered 2015-01-07: 1000 ug via INTRAMUSCULAR

## 2015-01-07 MED ORDER — SODIUM CHLORIDE 0.9 % IV SOLN
Freq: Once | INTRAVENOUS | Status: AC
  Administered 2015-01-07: 13:00:00 via INTRAVENOUS

## 2015-01-07 NOTE — Patient Instructions (Signed)
[facility] Discharge Instructions for Patients Receiving Chemotherapy  Today you received the following chemotherapy agents: Alimta/Carboplatin  To help prevent nausea and vomiting after your treatment, we encourage you to take your nausea medication as directed.   If you develop nausea and vomiting that is not controlled by your nausea medication, call the clinic.   BELOW ARE SYMPTOMS THAT SHOULD BE REPORTED IMMEDIATELY:  *FEVER GREATER THAN 100.5 F  *CHILLS WITH OR WITHOUT FEVER  NAUSEA AND VOMITING THAT IS NOT CONTROLLED WITH YOUR NAUSEA MEDICATION  *UNUSUAL SHORTNESS OF BREATH  *UNUSUAL BRUISING OR BLEEDING  TENDERNESS IN MOUTH AND THROAT WITH OR WITHOUT PRESENCE OF ULCERS  *URINARY PROBLEMS  *BOWEL PROBLEMS  UNUSUAL RASH Items with * indicate a potential emergency and should be followed up as soon as possible.  Feel free to call the clinic you have any questions or concerns. The clinic phone number is [phone].  Please show the CHEMO ALERT CARD at check-in to the Emergency Department and triage nurse.   

## 2015-01-07 NOTE — Progress Notes (Signed)
[facility] [phone]   [phone]  OFFICE PROGRESS NOTE  No PCP Per Patient No address on file  DIAGNOSIS: Stage IV (T2a, N0, M1b) non-small cell lung cancer, adenocarcinoma, with negative EGFR mutation and negative ALK gene translocation presented with large right upper lobe lung mass in addition to innumerable brain metastasis and questionable adrenal and bone metastasis diagnosed in January 2016.  PRIOR THERAPY: Whole brain irradiation under the care of Dr. Tammi Klippel. Completed 11/24/2014  CURRENT THERAPY: Systemic chemotherapy with carboplatin for an ECF 5 and Alimta at 500 mg/m given every 3 weeks. Status post 2 cycles  INTERVAL HISTORY: Jason Keller 54 y.o. male returns to the clinic today for a symptom management  accompanied by his wife. He reports an episode of his feet dragging when walking to the car last night as well as about an hour and a half of having difficulty speaking/forming his words. All symptoms had resolved by this morning. He had been taking 2 mg of dexamethasone daily but took 4 mg last night and again this morning. He is tolerating his chemotherapy with carboplatin and Alimta relatively well. He continues to complain of some issues with constipation and also reports decreased appetite.  He denies any numbness or tingling into his lower extremities or any loss of bowel or bladder function.  The patient denied having any significant chest pain, shortness breath,  or hemoptysis. He denied having any significant weight loss or night sweats. He has no nausea or vomiting, no fever or chills.   MEDICAL HISTORY: Past Medical History  Diagnosis Date  . Vertigo   . 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 Brain cancer     Invasive poorly differentiated adenocarcinoma RUL with 12 brain mets    ALLERGIES:  is allergic to oxycodone and penicillins.  MEDICATIONS:  Current  Outpatient Prescriptions  Medication Sig Dispense Refill  . dexamethasone (DECADRON) 4 MG tablet Take 1 tablet (4 mg total) by mouth 2 (two) times daily. 60 tablet 1  . famotidine (PEPCID) 20 MG tablet One at bedtime 30 tablet 2  . folic acid (FOLVITE) 1 MG tablet Take 1 tablet (1 mg total) by mouth daily. 30 tablet 4  . meclizine (ANTIVERT) 12.5 MG tablet Take 12.5 mg by mouth 2 (two) times daily as needed for dizziness.    Marland Kitchen morphine (MSIR) 15 MG tablet Take 1 tablet (15 mg total) by mouth every 4 (four) hours as needed for severe pain. 60 tablet 0  . pantoprazole (PROTONIX) 40 MG tablet Take 1 tablet (40 mg total) by mouth daily. Take 30-60 min before first meal of the day 30 tablet 2  . polyethylene glycol (MIRALAX / GLYCOLAX) packet Take 17 g by mouth daily.    . prochlorperazine (COMPAZINE) 10 MG tablet Take 1 tablet (10 mg total) by mouth every 6 (six) hours as needed for nausea or vomiting. 60 tablet 0  . senna (SENOKOT) 8.6 MG tablet Take 2 tablets by mouth 2 (two) times daily.    Marland Kitchen docusate sodium (COLACE) 100 MG capsule Take 100 mg by mouth daily. 2 once day    . emollient (BIAFINE) cream Apply topically as needed.    Marland Kitchen HYDROcodone-acetaminophen (NORCO/VICODIN) 5-325 MG per tablet Take 1-2 tablets by mouth every 6 (six) hours as needed for moderate pain. (Patient not taking: Reported on 01/07/2015) 120 tablet 0   No current facility-administered medications for  this visit.   Facility-Administered Medications Ordered in Other Visits  Medication Dose Route Frequency Provider Last Rate Last Dose  . topical emolient (BIAFINE) emulsion   Topical Daily Tyler Pita, MD        SURGICAL HISTORY:  Past Surgical History  Procedure Laterality Date  . Video bronchoscopy Bilateral 10/26/2014    Procedure: VIDEO BRONCHOSCOPY WITHOUT FLUORO;  Surgeon: Tanda Rockers, MD;  Location: [facility];  Service: Cardiopulmonary;  Laterality: Bilateral;    REVIEW OF SYSTEMS:  Constitutional:  positive for anorexia and weight loss Eyes: negative Ears, nose, mouth, throat, and face: negative Respiratory: negative Cardiovascular: negative Gastrointestinal: positive for constipation Genitourinary:negative Integument/breast: negative Hematologic/lymphatic: negative Musculoskeletal:negative Neurological: positive for gait problems, speech problems and episode lasted approximately 2 hours Behavioral/Psych: negative Endocrine: negative Allergic/Immunologic: negative   PHYSICAL EXAMINATION: General appearance: alert, cooperative and no distress Head: Normocephalic, without obvious abnormality, atraumatic Neck: no adenopathy, no JVD, supple, symmetrical, trachea midline and thyroid not enlarged, symmetric, no tenderness/mass/nodules Lymph nodes: Cervical, supraclavicular, and axillary nodes normal. Resp: clear to auscultation bilaterally Back: symmetric, no curvature. ROM normal. No CVA tenderness. Cardio: regular rate and rhythm, S1, S2 normal, no murmur, click, rub or gallop GI: soft, non-tender; bowel sounds normal; no masses,  no organomegaly Extremities: extremities normal, atraumatic, no cyanosis or edema Neurologic: Alert and oriented X 3, normal strength and tone. Normal symmetric reflexes. Normal coordination and gait  Mouth: reveals moderate thrush  ECOG PERFORMANCE STATUS: 1 - Symptomatic but completely ambulatory  Blood pressure 145/84, pulse 87, temperature 98.6 F (37 C), temperature source Oral, resp. rate 18, height $RemoveBe'5\' 10"'AzENvrlxD$  (1.778 m), weight 142 lb 8 oz (64.638 kg), SpO2 97 %.  LABORATORY DATA: Lab Results  Component Value Date   WBC 7.2 01/07/2015   HGB 13.6 01/07/2015   HCT 40.4 01/07/2015   MCV 93.7 01/07/2015   PLT 217 01/07/2015      Chemistry      Component Value Date/Time   NA 128* 01/07/2015 1136   NA 138 10/08/2014 2004   K 4.4 01/07/2015 1136   K 3.9 10/08/2014 2004   CL 106 10/08/2014 2004   CO2 25 01/07/2015 1136   CO2 21 10/08/2014 2004    BUN 13.6 01/07/2015 1136   BUN 11 10/08/2014 2004   CREATININE 0.7 01/07/2015 1136   CREATININE 0.74 10/08/2014 2004      Component Value Date/Time   CALCIUM 9.2 01/07/2015 1136   CALCIUM 9.2 10/08/2014 2004   ALKPHOS 65 01/07/2015 1136   ALKPHOS 44 10/08/2014 2004   AST 13 01/07/2015 1136   AST 18 10/08/2014 2004   ALT 25 01/07/2015 1136   ALT 16 10/08/2014 2004   BILITOT 0.50 01/07/2015 1136   BILITOT 0.4 10/08/2014 2004       RADIOGRAPHIC STUDIES: No results found.  ASSESSMENT AND PLAN: This is a very pleasant 54 years old white male recently diagnosed with a stage IV non-small cell lung cancer, adenocarcinoma with negative EGFR mutation and negative ALK gene translocation.  He completed whole brain irradiation for the multiple brain metastasis under the care of Dr. Tammi Klippel. He is now status post 2 cycles of systemic chemotherapy with carboplatin for an before meals of 5 and Alimta 500 mg/m given every 3 weeks.  He'll proceed with cycle #3 of his systemic chemotherapy with carboplatin and Alimta today as scheduled. He will continue with weekly labs as previously scheduled. For constipation patient was advised to continue taking MiraLax in the mornings and Senokot-S  in the evenings to better control his constipation.  I discussed his symptoms with Dr. Tammi Klippel,. We will increase his dexamethasone to 4 mg by mouth every 8 hours. I will also order a STAT MRI of the brain with and without contrast to evaluate for brain metastasis. Further testing/management will be determined pending the results of the MRI. He will otherwise return in 3 weeks with a restaging CT scan of the chest and abdomen with contrast to re-evaluate his disease, prior to cycle #4. For the oral candidiasis, a prescription for Diflucan was sent to his pharmacy of record via [place].   He was advised to call immediately if he has any concerning symptoms in the interval. The patient voices understanding of current  disease status and treatment options and is in agreement with the current care plan.  All questions were answered. The patient knows to call the clinic with any problems, questions or concerns. We can certainly see the patient much sooner if necessary.  Carlton Adam, PA-C 01/07/2015

## 2015-01-07 NOTE — Progress Notes (Signed)
  Radiation Oncology         [phone] ________________________________  Name: Haskell WENZLICK MRN: [id number]  Date: 01/06/2015  DOB: 1961-02-28  End of Treatment Note   ICD-9-CM ICD-10-CM   1. Non-small cell cancer of right lung 162.9 C34.91     DIAGNOSIS: 54 year old with painful flank metastasis and adrenal metastasis on the left side from     Indication for treatment:  Palliation       Radiation treatment dates:   12/24/2014-01/06/2015  Site/dose:   The left flank mass and adrenal were treated to 30 Gy in 10 fractions  Beams/energy:   15 MV X-rays were applied to a non-coaxial 3-field arrangement with conformal MLCs  Narrative: The patient tolerated radiation treatment relatively well.   His pain improved.  Plan: The patient has completed radiation treatment. The patient will return to radiation oncology clinic for routine followup in one month. I advised him to call or return sooner if he has any questions or concerns related to his recovery or treatment. ________________________________  Sheral Apley. Tammi Klippel, M.D.

## 2015-01-07 NOTE — Telephone Encounter (Signed)
Returned message left by patient' significant other, Jenny Reichmann. She states, "Somthing is wrong with Jason Keller." She goes on to explain he drags his feet when he walks, he has difficulty finding his words, and his speech is slurred. Also, she reports his entire body jerks often while he sleeps. Reports he is taking decadron 2 mg bid. However, he took 4 mg this morning in light of these new symptoms. Instructed her to proceed to med onc appointments. Explained this RN will inform Dr. Tammi Klippel of these findings and call her back with further directions. Noted the patient's last MRI was done 11/03/2015.

## 2015-01-08 ENCOUNTER — Telehealth: Payer: Self-pay | Admitting: *Deleted

## 2015-01-08 ENCOUNTER — Ambulatory Visit
Admission: RE | Admit: 2015-01-08 | Discharge: 2015-01-08 | Disposition: A | Discharge status: Home / Self Care | Payer: PRIVATE HEALTH INSURANCE | Source: Ambulatory Visit | Attending: Physician Assistant | Admitting: Physician Assistant

## 2015-01-08 DIAGNOSIS — C3491 Malignant neoplasm of unspecified part of right bronchus or lung: Secondary | ICD-10-CM

## 2015-01-08 DIAGNOSIS — C7931 Secondary malignant neoplasm of brain: Secondary | ICD-10-CM

## 2015-01-08 MED ORDER — GADOBENATE DIMEGLUMINE 529 MG/ML IV SOLN
13.0000 mL | Freq: Once | INTRAVENOUS | Status: AC | PRN
  Administered 2015-01-08: 13 mL via INTRAVENOUS

## 2015-01-08 MED ORDER — FLUCONAZOLE 100 MG PO TABS: 100.0000 mg | ORAL_TABLET | Freq: Every day | ORAL | Status: DC

## 2015-01-08 NOTE — Telephone Encounter (Signed)
Patient diagnosed with thrush yesterday, did not get a prescription.  Contacted Awilda Metro, PA for verbal order, same given

## 2015-01-10 NOTE — Patient Instructions (Signed)
You are being sent for a MRI of your brain to evaluate your recent symptoms Take Dexamethasone 4 mg by mouth every 8 hours Take Diflucan as prescribed for your thrush Follow up in 3 weeks with a restaging CT scan to re-evaluate your disease.

## 2015-01-12 ENCOUNTER — Telehealth: Payer: Self-pay | Admitting: Radiation Oncology

## 2015-01-12 ENCOUNTER — Other Ambulatory Visit: Payer: Self-pay | Admitting: Radiation Oncology

## 2015-01-12 DIAGNOSIS — C3491 Malignant neoplasm of unspecified part of right bronchus or lung: Secondary | ICD-10-CM

## 2015-01-12 MED ORDER — MORPHINE SULFATE 15 MG PO TABS: 15.0000 mg | ORAL_TABLET | ORAL | Status: DC | PRN

## 2015-01-12 NOTE — Telephone Encounter (Signed)
Returned message left by patient. Explained a decadron refill is already waiting [facility] [place]. Went on to explained that morphine refill request has been sent to Dr. Isidore Moos with high importance. This RN will call him once script is ready for pick up. Patient verbalized understanding.

## 2015-01-12 NOTE — Telephone Encounter (Signed)
Spoke with Nira Conn confirming Decadron refill is available [facility] [place]. Phoned patient back informing him of such.

## 2015-01-13 ENCOUNTER — Telehealth: Payer: Self-pay | Admitting: Internal Medicine

## 2015-01-13 NOTE — Telephone Encounter (Signed)
Previous phone message was an error,patient called in to reschdule his lab appointment

## 2015-01-13 NOTE — Telephone Encounter (Signed)
Called patient and she is aware of her new appointment for her echo and chemo ed

## 2015-01-14 ENCOUNTER — Other Ambulatory Visit (HOSPITAL_BASED_OUTPATIENT_CLINIC_OR_DEPARTMENT_OTHER): Payer: PRIVATE HEALTH INSURANCE

## 2015-01-14 ENCOUNTER — Other Ambulatory Visit: Payer: PRIVATE HEALTH INSURANCE

## 2015-01-14 DIAGNOSIS — C7931 Secondary malignant neoplasm of brain: Secondary | ICD-10-CM

## 2015-01-14 DIAGNOSIS — C3411 Malignant neoplasm of upper lobe, right bronchus or lung: Secondary | ICD-10-CM

## 2015-01-14 DIAGNOSIS — C3491 Malignant neoplasm of unspecified part of right bronchus or lung: Secondary | ICD-10-CM

## 2015-01-14 LAB — CBC WITH DIFFERENTIAL/PLATELET
BASO%: 0 % (ref 0.0–2.0)
Basophils Absolute: 0 10*3/uL (ref 0.0–0.1)
EOS%: 0.1 % (ref 0.0–7.0)
Eosinophils Absolute: 0 10*3/uL (ref 0.0–0.5)
HCT: 38.6 % (ref 38.4–49.9)
HGB: 13 g/dL (ref 13.0–17.1)
LYMPH%: 2.4 % — ABNORMAL LOW (ref 14.0–49.0)
MCH: 31.5 pg (ref 27.2–33.4)
MCHC: 33.6 g/dL (ref 32.0–36.0)
MCV: 93.8 fL (ref 79.3–98.0)
MONO#: 0.3 10*3/uL (ref 0.1–0.9)
MONO%: 6 % (ref 0.0–14.0)
NEUT#: 5 10*3/uL (ref 1.5–6.5)
NEUT%: 91.5 % — ABNORMAL HIGH (ref 39.0–75.0)
PLATELETS: 128 10*3/uL — AB (ref 140–400)
RBC: 4.12 10*6/uL — AB (ref 4.20–5.82)
RDW: 14.7 % — ABNORMAL HIGH (ref 11.0–14.6)
WBC: 5.5 10*3/uL (ref 4.0–10.3)
lymph#: 0.1 10*3/uL — ABNORMAL LOW (ref 0.9–3.3)

## 2015-01-14 LAB — COMPREHENSIVE METABOLIC PANEL (CC13)
ALBUMIN: 3.5 g/dL (ref 3.5–5.0)
ALK PHOS: 75 U/L (ref 40–150)
ALT: 38 U/L (ref 0–55)
ANION GAP: 12 meq/L — AB (ref 3–11)
AST: 17 U/L (ref 5–34)
BUN: 15.3 mg/dL (ref 7.0–26.0)
CALCIUM: 8.9 mg/dL (ref 8.4–10.4)
CO2: 25 mEq/L (ref 22–29)
Chloride: 94 mEq/L — ABNORMAL LOW (ref 98–109)
Creatinine: 0.6 mg/dL — ABNORMAL LOW (ref 0.7–1.3)
EGFR: >90 mL/min/{1.73_m2} (ref >90)
Glucose: 104 mg/dl (ref 70–140)
POTASSIUM: 4.3 meq/L (ref 3.5–5.1)
SODIUM: 131 meq/L — AB (ref 136–145)
TOTAL PROTEIN: 6.3 g/dL — AB (ref 6.4–8.3)
Total Bilirubin: 0.5 mg/dL (ref 0.20–1.20)

## 2015-01-20 ENCOUNTER — Telehealth: Payer: Self-pay | Admitting: Physician Assistant

## 2015-01-20 NOTE — Telephone Encounter (Signed)
Per beverly please reschedule the lab and patient is aware of the time  anne

## 2015-01-21 ENCOUNTER — Other Ambulatory Visit: Payer: PRIVATE HEALTH INSURANCE

## 2015-01-21 ENCOUNTER — Ambulatory Visit
Admission: RE | Admit: 2015-01-21 | Discharge: 2015-01-21 | Disposition: A | Discharge status: Home / Self Care | Payer: PRIVATE HEALTH INSURANCE | Source: Ambulatory Visit | Attending: Radiation Oncology | Admitting: Radiation Oncology

## 2015-01-21 ENCOUNTER — Encounter: Payer: Self-pay | Admitting: Radiation Oncology

## 2015-01-21 ENCOUNTER — Other Ambulatory Visit (HOSPITAL_BASED_OUTPATIENT_CLINIC_OR_DEPARTMENT_OTHER): Payer: PRIVATE HEALTH INSURANCE

## 2015-01-21 VITALS — BP 154/86 | HR 91 | Temp 98.0°F | Resp 18

## 2015-01-21 DIAGNOSIS — C7931 Secondary malignant neoplasm of brain: Secondary | ICD-10-CM

## 2015-01-21 DIAGNOSIS — C3411 Malignant neoplasm of upper lobe, right bronchus or lung: Secondary | ICD-10-CM

## 2015-01-21 DIAGNOSIS — C3491 Malignant neoplasm of unspecified part of right bronchus or lung: Secondary | ICD-10-CM

## 2015-01-21 LAB — CBC WITH DIFFERENTIAL/PLATELET
BASO%: 0 % (ref 0.0–2.0)
BASOS ABS: 0 10*3/uL (ref 0.0–0.1)
EOS ABS: 0 10*3/uL (ref 0.0–0.5)
EOS%: 0 % (ref 0.0–7.0)
HCT: 37 % — ABNORMAL LOW (ref 38.4–49.9)
HGB: 13 g/dL (ref 13.0–17.1)
LYMPH%: 1.5 % — ABNORMAL LOW (ref 14.0–49.0)
MCH: 32.7 pg (ref 27.2–33.4)
MCHC: 35.1 g/dL (ref 32.0–36.0)
MCV: 93 fL (ref 79.3–98.0)
MONO#: 0.3 10*3/uL (ref 0.1–0.9)
MONO%: 6.4 % (ref 0.0–14.0)
NEUT%: 92.1 % — ABNORMAL HIGH (ref 39.0–75.0)
NEUTROS ABS: 4.8 10*3/uL (ref 1.5–6.5)
PLATELETS: 81 10*3/uL — AB (ref 140–400)
RBC: 3.98 10*6/uL — ABNORMAL LOW (ref 4.20–5.82)
RDW: 15.2 % — AB (ref 11.0–14.6)
WBC: 5.2 10*3/uL (ref 4.0–10.3)
lymph#: 0.1 10*3/uL — ABNORMAL LOW (ref 0.9–3.3)

## 2015-01-21 LAB — COMPREHENSIVE METABOLIC PANEL (CC13)
ALBUMIN: 3.4 g/dL — AB (ref 3.5–5.0)
ALK PHOS: 81 U/L (ref 40–150)
ALT: 35 U/L (ref 0–55)
ANION GAP: 10 meq/L (ref 3–11)
AST: 16 U/L (ref 5–34)
BUN: 18.8 mg/dL (ref 7.0–26.0)
CHLORIDE: 97 meq/L — AB (ref 98–109)
CO2: 27 meq/L (ref 22–29)
Calcium: 8.5 mg/dL (ref 8.4–10.4)
Creatinine: 0.6 mg/dL — ABNORMAL LOW (ref 0.7–1.3)
GLUCOSE: 102 mg/dL (ref 70–140)
POTASSIUM: 4.4 meq/L (ref 3.5–5.1)
SODIUM: 135 meq/L — AB (ref 136–145)
TOTAL PROTEIN: 6 g/dL — AB (ref 6.4–8.3)
Total Bilirubin: 0.38 mg/dL (ref 0.20–1.20)

## 2015-01-21 NOTE — Progress Notes (Signed)
Radiation Oncology         [phone] ________________________________  Name: Jason Keller MRN: [id number]  Date: 01/21/2015  DOB: 11/23/1960  Follow-Up Visit Note  CC: No PCP Per Patient  No ref. 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 Brain metastases 198.3 C79.31     Interval Since Last Radiation:  2  weeks  Narrative:  The patient returns today for routine follow-up.  Reports taking decadron 4 mg tid. Reports taking morphine 15 mg every four hours. Reports very little back pain. Reports each morning he wakes with a mild headache. Reports he stumbles often. Reports he often feels dizzy. Infrequent nausea. Denies emesis or diarrhea. Denies episodes of confusion. Reports slurred slow speech. Denies change in short term memory. Reports ringing in the ears continues. Wife reports the patient fell last night but, didn't sustain an injury.                               ALLERGIES:  is allergic to oxycodone and penicillins.  Meds: Current Outpatient Prescriptions  Medication Sig Dispense Refill  . dexamethasone (DECADRON) 4 MG tablet Take 1 tablet (4 mg total) by mouth 2 (two) times daily. 60 tablet 1  . famotidine (PEPCID) 20 MG tablet One at bedtime 30 tablet 2  . folic acid (FOLVITE) 1 MG tablet Take 1 tablet (1 mg total) by mouth daily. 30 tablet 4  . morphine (MSIR) 15 MG tablet Take 1 tablet (15 mg total) by mouth every 4 (four) hours as needed for severe pain. 72 tablet 0  . pantoprazole (PROTONIX) 40 MG tablet Take 1 tablet (40 mg total) by mouth daily. Take 30-60 min before first meal of the day 30 tablet 2  . polyethylene glycol (MIRALAX / GLYCOLAX) packet Take 17 g by mouth daily.    . prochlorperazine (COMPAZINE) 10 MG tablet Take 1 tablet (10 mg total) by mouth every 6 (six) hours as needed for nausea or vomiting. 60 tablet 0  . senna (SENOKOT) 8.6 MG  tablet Take 2 tablets by mouth 2 (two) times daily.    Marland Kitchen docusate sodium (COLACE) 100 MG capsule Take 100 mg by mouth daily. 2 once day    . emollient (BIAFINE) cream Apply topically as needed.    . fluconazole (DIFLUCAN) 100 MG tablet Take 1 tablet (100 mg total) by mouth daily. Take 2 tablets (200 mg) on day 1, then take one tablet (100mg ) daily until complete (Patient not taking: Reported on 01/21/2015) 16 tablet 0  . HYDROcodone-acetaminophen (NORCO/VICODIN) 5-325 MG per tablet Take 1-2 tablets by mouth every 6 (six) hours as needed for moderate pain. (Patient not taking: Reported on 01/07/2015) 120 tablet 0  . meclizine (ANTIVERT) 12.5 MG tablet Take 12.5 mg by mouth 2 (two) times daily as needed for dizziness.     No current facility-administered medications for this encounter.   Facility-Administered Medications Ordered in Other Encounters  Medication Dose Route Frequency Provider Last Rate Last Dose  . topical emolient (BIAFINE) emulsion   Topical Daily Tyler Pita, MD        Physical Findings: The patient is in no acute distress. Patient is alert and oriented.  oral temperature is 98 F (36.7 C). His blood pressure is 154/86 and his pulse is 91. His respiration is 18 and oxygen  saturation is 100%. .  No significant changes.  Lab Findings: Lab Results  Component Value Date   WBC 5.2 01/21/2015   WBC 6.6 10/08/2014   HGB 13.0 01/21/2015   HGB 14.3 10/08/2014   HCT 37.0* 01/21/2015   HCT 41.0 10/08/2014   PLT 81* 01/21/2015   PLT 208 10/08/2014    Lab Results  Component Value Date   NA 135* 01/21/2015   NA 138 10/08/2014   K 4.4 01/21/2015   K 3.9 10/08/2014   CHLORIDE 97* 01/21/2015   CO2 27 01/21/2015   CO2 21 10/08/2014   GLUCOSE 102 01/21/2015   GLUCOSE 90 10/08/2014   BUN 18.8 01/21/2015   BUN 11 10/08/2014   CREATININE 0.6* 01/21/2015   CREATININE 0.74 10/08/2014   BILITOT 0.38 01/21/2015   BILITOT 0.4 10/08/2014   ALKPHOS 81 01/21/2015   ALKPHOS 44  10/08/2014   AST 16 01/21/2015   AST 18 10/08/2014   ALT 35 01/21/2015   ALT 16 10/08/2014   PROT 6.0* 01/21/2015   PROT 6.5 10/08/2014   ALBUMIN 3.4* 01/21/2015   ALBUMIN 3.9 10/08/2014   CALCIUM 8.5 01/21/2015   CALCIUM 9.2 10/08/2014   ANIONGAP 10 01/21/2015   ANIONGAP 11 10/08/2014    Radiographic Findings: Mr Jason Keller Wo Contrast  01/08/2015   CLINICAL DATA:  54 year old male with right upper lobe lung cancer and brain metastases treated with whole brain radiation which began in January 2016. First post treatment restaging scan. Subsequent encounter.  EXAM: MRI HEAD WITHOUT AND WITH CONTRAST  TECHNIQUE: Multiplanar, multiecho pulse sequences of the brain and surrounding structures were obtained without and with intravenous contrast.  CONTRAST:  16mL MULTIHANCE GADOBENATE DIMEGLUMINE 529 MG/ML IV SOLN  COMPARISON:  Targeting Brain MRI 11/02/2014 and earlier.  FINDINGS: All previously identified Brain metastases have regressed in size. Associated vasogenic edema has regressed 2 of the smallest lesions identified previously (left parietal lobe series 10, image 116 today and left cerebellar tonsil series 10, image 17) are no longer enhancing. Some of the lesions demonstrate micro hemorrhage, but without associated mass effect.  However, there is suggestion of new bilateral fifth nerve and seventh/eighth cranial nerve enhancement demonstrated on both axial and coronal post-contrast images (series 10, image 40 and 41, and 31 and 33, and also evident on coronal images. No other abnormal leptomeningeal enhancement is identified. The degree of dural enhancement/thickness appears stable and not definitely pathologic.  There is new abnormal T2 and FLAIR hyperintensity along the margins of the fourth ventricle with no associated abnormal enhancement. Diffusion here is facilitated.  Elsewhere stable gray and white matter signal. No restricted diffusion or evidence of acute infarction. Generalized mild  cerebral volume loss with mild ex vacuo ventricle enlargement. No transependymal edema. Basilar cisterns remain patent. Pituitary, cervicomedullary junction and visualized cervical spine are within normal limits. Bone marrow signal within normal limits. Major intracranial vascular flow voids are stable. Visualized orbit soft tissues are within normal limits. Mastoids are clear. Trace paranasal sinus mucosal thickening. Visualized scalp soft tissues are within normal limits.  IMPRESSION: 1. All previously identified brain metastases have regressed. Two are no longer enhancing. Associated edema has regressed. 2. Suggestion of new abnormal bilateral 5th and 7th/8th cisternal cranial nerve enhancement, such as due to new leptomeningeal metastatic disease. However, no other leptomeningeal enhancement is identified. This would be an unlikely artifactual appearance, but consider CSF analysis to confirm or refute the presence of leptomeningeal disease. 3. New mild T2 and FLAIR hyperintensity surrounding the  fourth ventricle, without associated enhancement or evidence of ventricular obstruction. Etiology is unclear, perhaps this relates to whole brain radiation. .   Electronically Signed   By: Genevie Ann M.D.   On: 01/08/2015 16:42    Impression:  The patient is recovering from the effects of radiation.  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  Plan: Further brain radio therapy is not recommended. Will obtain CSF sample for cytology and review results to determine next steps. Continue steroid TID    This document serves as a record of services personally performed by Tyler Pita, MD. It was created on his behalf by Pearlie Oyster, a trained medical scribe. The creation of this record is based on the scribe's personal observations and the provider's statements to them. This document has been checked and approved by the attending provider.       _____________________________________  Sheral Apley. Tammi Klippel, M.D.

## 2015-01-21 NOTE — Progress Notes (Signed)
Reports taking decadron 4 mg tid. Reports taking morphine 15 mg every four hours. Reports very little back pain. Reports each morning he wakes with a mild headache. Reports he stumbles often. Reports he often feels dizzy. Infrequent nausea. Denies emesis or diarrhea. Denies episodes of confusion. Reports slurred slow speech. Denies change in short term memory. Reports ringing in the ears continues. Wife reports the patient fell last night but, didn't sustain an injury.

## 2015-01-22 ENCOUNTER — Other Ambulatory Visit: Payer: Self-pay | Admitting: Radiation Oncology

## 2015-01-22 ENCOUNTER — Other Ambulatory Visit: Payer: Self-pay | Admitting: Radiation Therapy

## 2015-01-22 DIAGNOSIS — C7931 Secondary malignant neoplasm of brain: Secondary | ICD-10-CM

## 2015-01-26 ENCOUNTER — Telehealth: Payer: Self-pay | Admitting: *Deleted

## 2015-01-26 ENCOUNTER — Ambulatory Visit (HOSPITAL_BASED_OUTPATIENT_CLINIC_OR_DEPARTMENT_OTHER): Payer: PRIVATE HEALTH INSURANCE | Admitting: Nurse Practitioner

## 2015-01-26 ENCOUNTER — Other Ambulatory Visit: Payer: Self-pay | Admitting: *Deleted

## 2015-01-26 ENCOUNTER — Other Ambulatory Visit (HOSPITAL_BASED_OUTPATIENT_CLINIC_OR_DEPARTMENT_OTHER): Payer: PRIVATE HEALTH INSURANCE

## 2015-01-26 ENCOUNTER — Ambulatory Visit (HOSPITAL_BASED_OUTPATIENT_CLINIC_OR_DEPARTMENT_OTHER): Payer: PRIVATE HEALTH INSURANCE

## 2015-01-26 VITALS — BP 136/87 | HR 123 | Temp 98.8°F | Resp 16 | Wt 131.3 lb

## 2015-01-26 DIAGNOSIS — E860 Dehydration: Secondary | ICD-10-CM | POA: Diagnosis not present

## 2015-01-26 DIAGNOSIS — R531 Weakness: Secondary | ICD-10-CM

## 2015-01-26 DIAGNOSIS — F17210 Nicotine dependence, cigarettes, uncomplicated: Secondary | ICD-10-CM

## 2015-01-26 DIAGNOSIS — R634 Abnormal weight loss: Secondary | ICD-10-CM

## 2015-01-26 DIAGNOSIS — G893 Neoplasm related pain (acute) (chronic): Secondary | ICD-10-CM

## 2015-01-26 DIAGNOSIS — R110 Nausea: Secondary | ICD-10-CM | POA: Diagnosis not present

## 2015-01-26 DIAGNOSIS — C3411 Malignant neoplasm of upper lobe, right bronchus or lung: Secondary | ICD-10-CM

## 2015-01-26 DIAGNOSIS — C7931 Secondary malignant neoplasm of brain: Secondary | ICD-10-CM

## 2015-01-26 DIAGNOSIS — C3491 Malignant neoplasm of unspecified part of right bronchus or lung: Secondary | ICD-10-CM

## 2015-01-26 DIAGNOSIS — R630 Anorexia: Secondary | ICD-10-CM

## 2015-01-26 LAB — COMPREHENSIVE METABOLIC PANEL (CC13)
ALT: 36 U/L (ref 0–55)
AST: 19 U/L (ref 5–34)
Albumin: 3.5 g/dL (ref 3.5–5.0)
Alkaline Phosphatase: 101 U/L (ref 40–150)
Anion Gap: 10 mEq/L (ref 3–11)
BILIRUBIN TOTAL: 0.7 mg/dL (ref 0.20–1.20)
BUN: 20.5 mg/dL (ref 7.0–26.0)
CO2: 23 mEq/L (ref 22–29)
Calcium: 8.6 mg/dL (ref 8.4–10.4)
Chloride: 100 mEq/L (ref 98–109)
Creatinine: 0.7 mg/dL (ref 0.7–1.3)
EGFR: >90 mL/min/{1.73_m2} (ref >90)
Glucose: 95 mg/dl (ref 70–140)
Potassium: 3.8 mEq/L (ref 3.5–5.1)
Sodium: 134 mEq/L — ABNORMAL LOW (ref 136–145)
Total Protein: 6.5 g/dL (ref 6.4–8.3)

## 2015-01-26 LAB — CBC WITH DIFFERENTIAL/PLATELET
BASO%: 0.2 % (ref 0.0–2.0)
Basophils Absolute: 0 10*3/uL (ref 0.0–0.1)
EOS%: 0.4 % (ref 0.0–7.0)
Eosinophils Absolute: 0 10*3/uL (ref 0.0–0.5)
HCT: 41.4 % (ref 38.4–49.9)
HEMOGLOBIN: 14.8 g/dL (ref 13.0–17.1)
LYMPH#: 0.3 10*3/uL — AB (ref 0.9–3.3)
LYMPH%: 5.7 % — ABNORMAL LOW (ref 14.0–49.0)
MCH: 32.8 pg (ref 27.2–33.4)
MCHC: 35.7 g/dL (ref 32.0–36.0)
MCV: 91.8 fL (ref 79.3–98.0)
MONO#: 0.4 10*3/uL (ref 0.1–0.9)
MONO%: 7 % (ref 0.0–14.0)
NEUT#: 4.7 10*3/uL (ref 1.5–6.5)
NEUT%: 86.7 % — AB (ref 39.0–75.0)
Platelets: 101 10*3/uL — ABNORMAL LOW (ref 140–400)
RBC: 4.51 10*6/uL (ref 4.20–5.82)
RDW: 15.7 % — AB (ref 11.0–14.6)
WBC: 5.4 10*3/uL (ref 4.0–10.3)

## 2015-01-26 MED ORDER — SODIUM CHLORIDE 0.9 % IV SOLN
Freq: Once | INTRAVENOUS | Status: AC
  Administered 2015-01-26: 16:00:00 via INTRAVENOUS

## 2015-01-26 MED ORDER — SODIUM CHLORIDE 0.9 % IV SOLN
INTRAVENOUS | Status: AC
  Administered 2015-01-26: 15:00:00 via INTRAVENOUS

## 2015-01-26 NOTE — Telephone Encounter (Signed)
Received message from [name] requesting a call back from Dr. Worthy Flank nurse.  Spoke with Jason Keller, and was informed that pt is very weak; not eating, drinking very little, and hardly doing anything.  Per mother, pt is still alert and oriented, but just seems like out of space.  Pt complained of abdominal pain when eating; therefore, pt not eating much at all.  Pt is taking pain meds as prescribed, and Jason Keller thinks pt had good bms on Sunday.  Jason Keller stated pt has lost more weight since not eating and drinking.  Jason Keller did not think pt could take chemo on Thursday 4/14. Instructed Jason Keller to bring pt in today at 1pm for lab and to see Jenny Reichmann, NP symptom management at 130pm.  Jason Keller voiced understanding. Jason Keller's  Phone   [phone].

## 2015-01-26 NOTE — Patient Instructions (Signed)
Dehydration, Adult Dehydration is when you lose more fluids from the body than you take in. Vital organs like the kidneys, brain, and heart cannot function without a proper amount of fluids and salt. Any loss of fluids from the body can cause dehydration.  CAUSES   Vomiting.  Diarrhea.  Excessive sweating.  Excessive urine output.  Fever. SYMPTOMS  Mild dehydration  Thirst.  Dry lips.  Slightly dry mouth. Moderate dehydration  Very dry mouth.  Sunken eyes.  Skin does not bounce back quickly when lightly pinched and released.  Dark urine and decreased urine production.  Decreased tear production.  Headache. Severe dehydration  Very dry mouth.  Extreme thirst.  Rapid, weak pulse (more than 100 beats per minute at rest).  Cold hands and feet.  Not able to sweat in spite of heat and temperature.  Rapid breathing.  Blue lips.  Confusion and lethargy.  Difficulty being awakened.  Minimal urine production.  No tears. DIAGNOSIS  Your caregiver will diagnose dehydration based on your symptoms and your exam. Blood and urine tests will help confirm the diagnosis. The diagnostic evaluation should also identify the cause of dehydration. TREATMENT  Treatment of mild or moderate dehydration can often be done at home by increasing the amount of fluids that you drink. It is best to drink small amounts of fluid more often. Drinking too much at one time can make vomiting worse. Refer to the home care instructions below. Severe dehydration needs to be treated at the hospital where you will probably be given intravenous (IV) fluids that contain water and electrolytes. HOME CARE INSTRUCTIONS   Ask your caregiver about specific rehydration instructions.  Drink enough fluids to keep your urine clear or pale yellow.  Drink small amounts frequently if you have nausea and vomiting.  Eat as you normally do.  Avoid:  Foods or drinks high in sugar.  Carbonated  drinks.  Juice.  Extremely hot or cold fluids.  Drinks with caffeine.  Fatty, greasy foods.  Alcohol.  Tobacco.  Overeating.  Gelatin desserts.  Wash your hands well to avoid spreading bacteria and viruses.  Only take over-the-counter or prescription medicines for pain, discomfort, or fever as directed by your caregiver.  Ask your caregiver if you should continue all prescribed and over-the-counter medicines.  Keep all follow-up appointments with your caregiver. SEEK MEDICAL CARE IF:  You have abdominal pain and it increases or stays in one area (localizes).  You have a rash, stiff neck, or severe headache.  You are irritable, sleepy, or difficult to awaken.  You are weak, dizzy, or extremely thirsty. SEEK IMMEDIATE MEDICAL CARE IF:   You are unable to keep fluids down or you get worse despite treatment.  You have frequent episodes of vomiting or diarrhea.  You have blood or green matter (bile) in your vomit.  You have blood in your stool or your stool looks black and tarry.  You have not urinated in 6 to 8 hours, or you have only urinated a small amount of very dark urine.  You have a fever.  You faint. MAKE SURE YOU:   Understand these instructions.  Will watch your condition.  Will get help right away if you are not doing well or get worse. Document Released: 10/02/2005 Document Revised: 12/25/2011 Document Reviewed: 05/22/2011 [employer] Patient Information 2015 [employer]. This information is not intended to replace advice given to you by your health care provider. Make sure you discuss any questions you have with your health care   provider.  

## 2015-01-26 NOTE — Progress Notes (Unsigned)
1500-Per C. Berniece Salines NP, give pt 1500 ml NS over 2 hours today.

## 2015-01-27 ENCOUNTER — Ambulatory Visit (HOSPITAL_BASED_OUTPATIENT_CLINIC_OR_DEPARTMENT_OTHER): Payer: PRIVATE HEALTH INSURANCE

## 2015-01-27 ENCOUNTER — Encounter: Payer: Self-pay | Admitting: Nurse Practitioner

## 2015-01-27 ENCOUNTER — Telehealth: Payer: Self-pay

## 2015-01-27 VITALS — BP 159/76 | HR 69 | Temp 97.6°F | Resp 18

## 2015-01-27 DIAGNOSIS — C3411 Malignant neoplasm of upper lobe, right bronchus or lung: Secondary | ICD-10-CM

## 2015-01-27 DIAGNOSIS — R630 Anorexia: Secondary | ICD-10-CM | POA: Insufficient documentation

## 2015-01-27 DIAGNOSIS — E860 Dehydration: Secondary | ICD-10-CM | POA: Diagnosis not present

## 2015-01-27 DIAGNOSIS — R110 Nausea: Secondary | ICD-10-CM | POA: Insufficient documentation

## 2015-01-27 DIAGNOSIS — C7931 Secondary malignant neoplasm of brain: Secondary | ICD-10-CM | POA: Diagnosis not present

## 2015-01-27 DIAGNOSIS — C3490 Malignant neoplasm of unspecified part of unspecified bronchus or lung: Secondary | ICD-10-CM

## 2015-01-27 DIAGNOSIS — R634 Abnormal weight loss: Secondary | ICD-10-CM | POA: Insufficient documentation

## 2015-01-27 DIAGNOSIS — R531 Weakness: Secondary | ICD-10-CM | POA: Insufficient documentation

## 2015-01-27 MED ORDER — SODIUM CHLORIDE 0.9 % IV SOLN
Freq: Once | INTRAVENOUS | Status: DC | PRN
  Filled 2015-01-27: qty 4

## 2015-01-27 MED ORDER — ONDANSETRON 8 MG PO TBDP: 8.0000 mg | ORAL_TABLET | Freq: Three times a day (TID) | ORAL | Status: AC | PRN

## 2015-01-27 MED ORDER — SODIUM CHLORIDE 0.9 % IV SOLN
INTRAVENOUS | Status: DC
  Administered 2015-01-27: 15:00:00 via INTRAVENOUS

## 2015-01-27 MED ORDER — SODIUM CHLORIDE 0.9 % IV SOLN
Freq: Once | INTRAVENOUS | Status: AC
  Administered 2015-01-27: 15:00:00 via INTRAVENOUS
  Filled 2015-01-27: qty 4

## 2015-01-27 MED ORDER — SODIUM CHLORIDE 0.9 % IV SOLN: INTRAVENOUS | Status: AC

## 2015-01-27 NOTE — Progress Notes (Signed)
SYMPTOM MANAGEMENT CLINIC   HPI: Jason Keller 54 y.o. male diagnosed with lung cancer; with brain metastasis.  Also questionable metastasis to both the adrenals and bone.  Patient is status post whole brain irradiation completed on 11/24/2014..  Currently undergoing carboplatin/Alimta chemotherapy regimen.   Patient complaining of chronic nausea and very poor appetite.  He has had little oral intake; and is complaining of increased fatigue and progressive weakness.  Patient has had an 11-1/2 pound weight loss since his last weight check.  Patient's family report that he is sleeping most of the day now; and requires assistance with ambulation.  Patient feels very dehydrated today.  He denies any diarrhea or constipation.  Patient suffers with chronic pain; and has been taking morphine 15 mg immediate release tablets every 4 hours.  His family states that he has been more fatigued since taking the pain medications; and they feel he may be overmedicated.  Morphine 15 mg immediate release tablets was increased to 1 tablet every 6 hours around-the-clock.  Patient denies any recent fevers or chills.  HPI  ROS  Past Medical History  Diagnosis Date  . Vertigo   . 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 Video bronchoscopy Bilateral 10/26/2014    Procedure: VIDEO BRONCHOSCOPY WITHOUT FLUORO;  Surgeon: Tanda Rockers, MD;  Location: [facility];  Service: Cardiopulmonary;  Laterality: Bilateral;    has Lung mass; Brain metastases; Non-small cell cancer of right lung; Neoplasm related pain; Nausea without vomiting; Anorexia; Dehydration; Weakness; and Weight loss on his problem list.    is allergic to oxycodone and penicillins.    Medication List       This list is accurate as of: 01/26/15 11:59  PM.  Always use your most recent med list.               dexamethasone 4 MG tablet  Commonly known as:  DECADRON  Take 1 tablet (4 mg total) by mouth 2 (two) times daily.     docusate sodium 100 MG capsule  Commonly known as:  COLACE  Take 100 mg by mouth daily. 2 once day     emollient cream  Commonly known as:  BIAFINE  Apply topically as needed.     famotidine 20 MG tablet  Commonly known as:  PEPCID  One at bedtime     fluconazole 100 MG tablet  Commonly known as:  DIFLUCAN  Take 1 tablet (100 mg total) by mouth daily. Take 2 tablets (200 mg) on day 1, then take one tablet ($RemoveBef'100mg'QKChErOMWu$ ) daily until complete     folic acid 1 MG tablet  Commonly known as:  FOLVITE  Take 1 tablet (1 mg total) by mouth daily.     HYDROcodone-acetaminophen 5-325 MG per tablet  Commonly known as:  NORCO/VICODIN  Take 1-2 tablets by mouth every 6 (six) hours as needed for moderate pain.     meclizine 12.5 MG tablet  Commonly known as:  ANTIVERT  Take 12.5 mg by mouth 2 (two) times daily as needed for dizziness.     morphine 15 MG tablet  Commonly known as:  MSIR  Take 1 tablet (15 mg total) by mouth every 4 (four) hours as needed for severe pain.     pantoprazole 40 MG  tablet  Commonly known as:  PROTONIX  Take 1 tablet (40 mg total) by mouth daily. Take 30-60 min before first meal of the day     polyethylene glycol packet  Commonly known as:  MIRALAX / GLYCOLAX  Take 17 g by mouth daily.     prochlorperazine 10 MG tablet  Commonly known as:  COMPAZINE  Take 1 tablet (10 mg total) by mouth every 6 (six) hours as needed for nausea or vomiting.     senna 8.6 MG tablet  Commonly known as:  SENOKOT  Take 2 tablets by mouth 2 (two) times daily.         PHYSICAL EXAMINATION  Oncology Vitals 01/27/2015 01/27/2015 01/27/2015 01/26/2015 01/26/2015 01/26/2015 01/21/2015  Height - - - - - - -  Weight - - - - 59.557 kg 59.557 kg -  Weight (lbs) - - - - 131 lbs 5 oz 131 lbs 5 oz -  BMI (kg/m2) -  - - - - - -  Temp 97.6 97.8 97 - 98.8 - 98  Pulse 69 66 112 - 123 - 91  Resp $Rem'18 18 18 'YwjB$ - 16 - 18  SpO2 99 - 98 100 98 - 100  BSA (m2) - - - - - - -   BP Readings from Last 3 Encounters:  01/27/15 159/76  01/26/15 136/87  01/21/15 154/86    Physical Exam  Constitutional: Vital signs are normal. He appears malnourished and dehydrated. He appears unhealthy. He appears cachectic.  Patient appears fatigued, weak, frail, and chronically ill.  HENT:  Head: Normocephalic and atraumatic.  Mouth/Throat: Oropharynx is clear and moist.  Eyes: Conjunctivae and EOM are normal. Pupils are equal, round, and reactive to light. Right eye exhibits no discharge. Left eye exhibits no discharge. No scleral icterus.  Neck: Normal range of motion. Neck supple. No JVD present. No tracheal deviation present. No thyromegaly present.  Cardiovascular: Normal heart sounds and intact distal pulses.   Tachycardic rate.  Pulmonary/Chest: Effort normal and breath sounds normal. No respiratory distress. He has no wheezes. He has no rales. He exhibits no tenderness.  Abdominal: Soft. Bowel sounds are normal. He exhibits no distension and no mass. There is no tenderness. There is no rebound and no guarding.  Musculoskeletal: Normal range of motion. He exhibits no edema or tenderness.  Lymphadenopathy:    He has no cervical adenopathy.  Neurological:  Patient will answer simple questions and follows simple commands only.  He appears slightly lethargic and very weak.  Skin: Skin is warm and dry. No rash noted. No erythema. There is pallor.  Psychiatric:  Flat affect.  Nursing note and vitals reviewed.   LABORATORY DATA:. Appointment on 01/26/2015  Component Date Value Ref Range Status  . WBC 01/26/2015 5.4  4.0 - 10.3 10e3/uL Final  . NEUT# 01/26/2015 4.7  1.5 - 6.5 10e3/uL Final  . HGB 01/26/2015 14.8  13.0 - 17.1 g/dL Final  . HCT 01/26/2015 41.4  38.4 - 49.9 % Final  . Platelets 01/26/2015 101* 140 - 400  10e3/uL Final  . MCV 01/26/2015 91.8  79.3 - 98.0 fL Final  . MCH 01/26/2015 32.8  27.2 - 33.4 pg Final  . MCHC 01/26/2015 35.7  32.0 - 36.0 g/dL Final  . RBC 01/26/2015 4.51  4.20 - 5.82 10e6/uL Final  . RDW 01/26/2015 15.7* 11.0 - 14.6 % Final  . lymph# 01/26/2015 0.3* 0.9 - 3.3 10e3/uL Final  . MONO# 01/26/2015 0.4  0.1 - 0.9 10e3/uL Final  .  Eosinophils Absolute 01/26/2015 0.0  0.0 - 0.5 10e3/uL Final  . Basophils Absolute 01/26/2015 0.0  0.0 - 0.1 10e3/uL Final  . NEUT% 01/26/2015 86.7* 39.0 - 75.0 % Final  . LYMPH% 01/26/2015 5.7* 14.0 - 49.0 % Final  . MONO% 01/26/2015 7.0  0.0 - 14.0 % Final  . EOS% 01/26/2015 0.4  0.0 - 7.0 % Final  . BASO% 01/26/2015 0.2  0.0 - 2.0 % Final  . Sodium 01/26/2015 134* 136 - 145 mEq/L Final  . Potassium 01/26/2015 3.8  3.5 - 5.1 mEq/L Final  . Chloride 01/26/2015 100  98 - 109 mEq/L Final  . CO2 01/26/2015 23  22 - 29 mEq/L Final  . Glucose 01/26/2015 95  70 - 140 mg/dl Final  . BUN 01/26/2015 20.5  7.0 - 26.0 mg/dL Final  . Creatinine 01/26/2015 0.7  0.7 - 1.3 mg/dL Final  . Total Bilirubin 01/26/2015 0.70  0.20 - 1.20 mg/dL Final  . Alkaline Phosphatase 01/26/2015 101  40 - 150 U/L Final  . AST 01/26/2015 19  5 - 34 U/L Final  . ALT 01/26/2015 36  0 - 55 U/L Final  . Total Protein 01/26/2015 6.5  6.4 - 8.3 g/dL Final  . Albumin 01/26/2015 3.5  3.5 - 5.0 g/dL Final  . Calcium 01/26/2015 8.6  8.4 - 10.4 mg/dL Final  . Anion Gap 01/26/2015 10  3 - 11 mEq/L Final  . EGFR 01/26/2015 >90  >90 ml/min/1.73 m2 Final   eGFR is calculated using the CKD-EPI Creatinine Equation (2009)     RADIOGRAPHIC STUDIES: No results found.  ASSESSMENT/PLAN:    Non-small cell cancer of right lung Patient completed whole brain irradiation on 11/24/2014. Patient completed cycle 3 of his carboplatin/Alimta chemotherapy regimen on 01/07/2015.  He was initially scheduled to receive cycle 4 of the same regimen on 01/28/2015; but the decision has been made to hold  chemotherapy this week due to patient's progressive weakness and dehydration.  Dr. Julien Nordmann reviewed restaging brain MRI obtained on 01/08/2015 with both patient and family today.  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  Patient was advised to increase his dexamethasone 4 mg from 3 times per day up to 4 times per day.  Also, will have patient return to the [facility] daily for IV fluid rehydration.  Advised patient and his family would reassess patient this coming Friday, 01/29/2015 to see if he is feeling any better; prior to making any further decisions regarding care plan.      Neoplasm related pain Patient has been taking morphine 15 mg immediate release tablets on an every four-hour basis for chronic, generalized pain.  His family reports the patient has been very lethargic and weak; sleeping most of the time since initiating the morphine tablets.  They have changed his morphine tablets to every 6 hours around-the-clock; but patient continues very tired and weak.   Advised both patient and his family to only take the morphine tablets every 8 hours; and to make sure that he only takes his pain medication when he actually has pain complaints.  Also, advised both patient and his family that patient's progressive symptoms may very well be due to his pain medication; but could also be secondary to dehydration or progression of disease.   Nausea without vomiting Patient complains intermittently of some mild nausea; but no vomiting.  He has Compazine at home; but states it is only minimally effective.  Will prescribe Zofran ODT for the patient to try at home  on an as-needed basis.  He is scheduled to return on a daily basis for IV fluid rehydration as well.   Anorexia Patient has had minimal appetite; and continues to lose weight.  Most likely, this is a chemotherapy side effect; but could also be attributed to patient's chronic nausea.  Patient was encouraged to eat  multiple small meals throughout the day if at all possible.   Dehydration Patient complains of some chronic nausea; but no vomiting.  He has minimal appetite; he continues to lose weight.  Patient has lost approximately 11-1/2 pounds since his last weight check.  Patient does appear weak and dehydrated today.  Patient will receive approximate 1500 ML's normal saline IV fluid rehydration while cancer Center today.  Also, patient will be scheduled to return on a daily basis this week for additional IV fluid rehydration.  He was also encouraged to push fluids at home is much as possible.     Weakness Patient is complaining of increased fatigue and weakness over the past few weeks.  He continues to complain of chronic nausea and minimal appetite.  He has very poor oral intake and appears dehydrated today.  He continues to lose weight.  Patient's family reports that patient spends much of his day sleeping.  Patient appears to week to ambulate on his own.  These progressive symptoms most likely secondary to chemotherapy and possible progression of his disease.   Weight loss Patient continues with chronic nausea and very poor appetite.  He has little oral intake; and continues to lose weight.  He does appear dehydrated today.  Patient has lost approximately 11-1/2 pounds since his last weight check.  Patient was advised to push fluids and eat multiple small meals throughout the day if at all possible.   Patient stated understanding of all instructions; and was in agreement with this plan of care. The patient knows to call the clinic with any problems, questions or concerns.   This was a shared visit with Dr. Julien Nordmann today.  Total time spent with patient was 40 minutes;  with greater than 75 percent of that time spent in face to face counseling regarding patient's symptoms,  and coordination of care and follow up.  Disclaimer: This note was dictated with voice recognition software. Similar sounding  words can inadvertently be transcribed and may not be corrected upon review.   Drue Second, NP 01/27/2015   ADDENDUM: Hematology/Oncology Attending: I had a face to face encounter with the patient. I recommended his care plan. This is a very pleasant 54 years old white male with metastatic non-small cell lung cancer with extensive brain metastasis status post whole brain irradiation and currently undergoing systemic chemotherapy with carboplatin and Alimta status post 3 cycles. The patient has been complaining of increasing fatigue and weakness as well as confusion and weight loss. He has poor appetite and unable to take enough nutrition. He had a recent MRI of the brain that showed questionable leptomeningeal disease. He is supposed to have restaging scans for evaluation of his disease before starting cycle #4 later this week but this was not done yet. I had a lengthy discussion with the patient and his family about his condition. I recommended for the patient to continue with daily IV hydration with normal saline for now. I will also start the patient on Decadron 4 mg by mouth every 6 hours. I also discussed with them the option of proceeding with lumbar puncture and CSF evaluation for malignant cells. The patient his family  would like to hold on this for now. He will have reimaging studies for evaluation of his disease and as well as any evidence for disease progression the patient may be considered for palliative care and hospice referral as he is declining rapidly and unfortunately no good treatment for patient with leptomeningeal disease. He would come back for follow-up visit in 4 days for evaluation and further discussion of his condition and treatment options. The patient his family were advised to call immediately if he has any other concerning symptoms in the interval.  Disclaimer: This note was dictated with voice recognition software. Similar sounding words can inadvertently be  transcribed and may be missed upon review. Eilleen Kempf., MD 01/27/2015

## 2015-01-27 NOTE — Assessment & Plan Note (Signed)
Patient has been taking morphine 15 mg immediate release tablets on an every four-hour basis for chronic, generalized pain.  His family reports the patient has been very lethargic and weak; sleeping most of the time since initiating the morphine tablets.  They have changed his morphine tablets to every 6 hours around-the-clock; but patient continues very tired and weak.   Advised both patient and his family to only take the morphine tablets every 8 hours; and to make sure that he only takes his pain medication when he actually has pain complaints.  Also, advised both patient and his family that patient's progressive symptoms may very well be due to his pain medication; but could also be secondary to dehydration or progression of disease.

## 2015-01-27 NOTE — Assessment & Plan Note (Signed)
Patient completed whole brain irradiation on 11/24/2014. Patient completed cycle 3 of his carboplatin/Alimta chemotherapy regimen on 01/07/2015.  He was initially scheduled to receive cycle 4 of the same regimen on 01/28/2015; but the decision has been made to hold chemotherapy this week due to patient's progressive weakness and dehydration.  Dr. Julien Nordmann reviewed restaging brain MRI obtained on 01/08/2015 with both patient and family today.  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  Patient was advised to increase his dexamethasone 4 mg from 3 times per day up to 4 times per day.  Also, will have patient return to the [facility] daily for IV fluid rehydration.  Advised patient and his family would reassess patient this coming Friday, 01/29/2015 to see if he is feeling any better; prior to making any further decisions regarding care plan.

## 2015-01-27 NOTE — Assessment & Plan Note (Signed)
Patient complains intermittently of some mild nausea; but no vomiting.  He has Compazine at home; but states it is only minimally effective.  Will prescribe Zofran ODT for the patient to try at home on an as-needed basis.  He is scheduled to return on a daily basis for IV fluid rehydration as well.

## 2015-01-27 NOTE — Assessment & Plan Note (Signed)
Patient continues with chronic nausea and very poor appetite.  He has little oral intake; and continues to lose weight.  He does appear dehydrated today.  Patient has lost approximately 11-1/2 pounds since his last weight check.  Patient was advised to push fluids and eat multiple small meals throughout the day if at all possible.

## 2015-01-27 NOTE — Telephone Encounter (Signed)
S/w pt while in infusion. His fiance says he is just a smidgen better. Pt shakes his head no. He is nauseated-zofran is ordered. He is in pain. His fiance gave him 7.5mg   MSIR at aobut 7 am and another 7.5 mg MSIR about 130 before getting in car for [facility]. S/w Selena Lesser NP and back to the pt. Explained that pt is to try to cut back on pain med but they may have gone too far. It is a balancing act between pain control and sleeping all the time. Also pt does not feel like eating after taking pain med. Discussed ensure and taking it like "medicine" rather than food, sort of taking it whether he wants to or not.

## 2015-01-27 NOTE — Assessment & Plan Note (Signed)
Patient has had minimal appetite; and continues to lose weight.  Most likely, this is a chemotherapy side effect; but could also be attributed to patient's chronic nausea.  Patient was encouraged to eat multiple small meals throughout the day if at all possible.

## 2015-01-27 NOTE — Assessment & Plan Note (Signed)
Patient is complaining of increased fatigue and weakness over the past few weeks.  He continues to complain of chronic nausea and minimal appetite.  He has very poor oral intake and appears dehydrated today.  He continues to lose weight.  Patient's family reports that patient spends much of his day sleeping.  Patient appears to week to ambulate on his own.  These progressive symptoms most likely secondary to chemotherapy and possible progression of his disease.

## 2015-01-27 NOTE — Patient Instructions (Signed)
Dehydration, Adult Dehydration is when you lose more fluids from the body than you take in. Vital organs like the kidneys, brain, and heart cannot function without a proper amount of fluids and salt. Any loss of fluids from the body can cause dehydration.  CAUSES   Vomiting.  Diarrhea.  Excessive sweating.  Excessive urine output.  Fever. SYMPTOMS  Mild dehydration  Thirst.  Dry lips.  Slightly dry mouth. Moderate dehydration  Very dry mouth.  Sunken eyes.  Skin does not bounce back quickly when lightly pinched and released.  Dark urine and decreased urine production.  Decreased tear production.  Headache. Severe dehydration  Very dry mouth.  Extreme thirst.  Rapid, weak pulse (more than 100 beats per minute at rest).  Cold hands and feet.  Not able to sweat in spite of heat and temperature.  Rapid breathing.  Blue lips.  Confusion and lethargy.  Difficulty being awakened.  Minimal urine production.  No tears. DIAGNOSIS  Your caregiver will diagnose dehydration based on your symptoms and your exam. Blood and urine tests will help confirm the diagnosis. The diagnostic evaluation should also identify the cause of dehydration. TREATMENT  Treatment of mild or moderate dehydration can often be done at home by increasing the amount of fluids that you drink. It is best to drink small amounts of fluid more often. Drinking too much at one time can make vomiting worse. Refer to the home care instructions below. Severe dehydration needs to be treated at the hospital where you will probably be given intravenous (IV) fluids that contain water and electrolytes. HOME CARE INSTRUCTIONS   Ask your caregiver about specific rehydration instructions.  Drink enough fluids to keep your urine clear or pale yellow.  Drink small amounts frequently if you have nausea and vomiting.  Eat as you normally do.  Avoid:  Foods or drinks high in sugar.  Carbonated  drinks.  Juice.  Extremely hot or cold fluids.  Drinks with caffeine.  Fatty, greasy foods.  Alcohol.  Tobacco.  Overeating.  Gelatin desserts.  Wash your hands well to avoid spreading bacteria and viruses.  Only take over-the-counter or prescription medicines for pain, discomfort, or fever as directed by your caregiver.  Ask your caregiver if you should continue all prescribed and over-the-counter medicines.  Keep all follow-up appointments with your caregiver. SEEK MEDICAL CARE IF:  You have abdominal pain and it increases or stays in one area (localizes).  You have a rash, stiff neck, or severe headache.  You are irritable, sleepy, or difficult to awaken.  You are weak, dizzy, or extremely thirsty. SEEK IMMEDIATE MEDICAL CARE IF:   You are unable to keep fluids down or you get worse despite treatment.  You have frequent episodes of vomiting or diarrhea.  You have blood or green matter (bile) in your vomit.  You have blood in your stool or your stool looks black and tarry.  You have not urinated in 6 to 8 hours, or you have only urinated a small amount of very dark urine.  You have a fever.  You faint. MAKE SURE YOU:   Understand these instructions.  Will watch your condition.  Will get help right away if you are not doing well or get worse. Document Released: 10/02/2005 Document Revised: 12/25/2011 Document Reviewed: 05/22/2011 [employer] Patient Information 2015 [employer]. This information is not intended to replace advice given to you by your health care provider. Make sure you discuss any questions you have with your health care   provider.  

## 2015-01-27 NOTE — Assessment & Plan Note (Signed)
Patient complains of some chronic nausea; but no vomiting.  He has minimal appetite; he continues to lose weight.  Patient has lost approximately 11-1/2 pounds since his last weight check.  Patient does appear weak and dehydrated today.  Patient will receive approximate 1500 ML's normal saline IV fluid rehydration while cancer Center today.  Also, patient will be scheduled to return on a daily basis this week for additional IV fluid rehydration.  He was also encouraged to push fluids at home is much as possible.

## 2015-01-28 ENCOUNTER — Ambulatory Visit (HOSPITAL_BASED_OUTPATIENT_CLINIC_OR_DEPARTMENT_OTHER): Payer: PRIVATE HEALTH INSURANCE

## 2015-01-28 ENCOUNTER — Ambulatory Visit: Payer: PRIVATE HEALTH INSURANCE

## 2015-01-28 ENCOUNTER — Other Ambulatory Visit: Payer: PRIVATE HEALTH INSURANCE

## 2015-01-28 ENCOUNTER — Ambulatory Visit: Payer: PRIVATE HEALTH INSURANCE | Admitting: Physician Assistant

## 2015-01-28 ENCOUNTER — Telehealth: Payer: Self-pay | Admitting: *Deleted

## 2015-01-28 ENCOUNTER — Encounter: Payer: Self-pay | Admitting: *Deleted

## 2015-01-28 VITALS — BP 155/92 | HR 70 | Temp 98.4°F | Resp 18

## 2015-01-28 DIAGNOSIS — R634 Abnormal weight loss: Secondary | ICD-10-CM

## 2015-01-28 DIAGNOSIS — G893 Neoplasm related pain (acute) (chronic): Secondary | ICD-10-CM

## 2015-01-28 DIAGNOSIS — E860 Dehydration: Secondary | ICD-10-CM

## 2015-01-28 DIAGNOSIS — C7931 Secondary malignant neoplasm of brain: Secondary | ICD-10-CM | POA: Diagnosis not present

## 2015-01-28 DIAGNOSIS — C3411 Malignant neoplasm of upper lobe, right bronchus or lung: Secondary | ICD-10-CM

## 2015-01-28 DIAGNOSIS — R630 Anorexia: Secondary | ICD-10-CM

## 2015-01-28 DIAGNOSIS — R531 Weakness: Secondary | ICD-10-CM

## 2015-01-28 DIAGNOSIS — C3491 Malignant neoplasm of unspecified part of right bronchus or lung: Secondary | ICD-10-CM

## 2015-01-28 DIAGNOSIS — F17210 Nicotine dependence, cigarettes, uncomplicated: Secondary | ICD-10-CM

## 2015-01-28 DIAGNOSIS — R110 Nausea: Secondary | ICD-10-CM

## 2015-01-28 MED ORDER — SODIUM CHLORIDE 0.9 % IV SOLN
Freq: Once | INTRAVENOUS | Status: AC
  Administered 2015-01-28: 15:00:00 via INTRAVENOUS
  Filled 2015-01-28: qty 4

## 2015-01-28 MED ORDER — SODIUM CHLORIDE 0.9 % IV SOLN
INTRAVENOUS | Status: DC
  Administered 2015-01-28: 15:00:00 via INTRAVENOUS

## 2015-01-28 NOTE — Telephone Encounter (Signed)
Patient called and notified of appt

## 2015-01-28 NOTE — Patient Instructions (Signed)
Dehydration, Adult Dehydration is when you lose more fluids from the body than you take in. Vital organs like the kidneys, brain, and heart cannot function without a proper amount of fluids and salt. Any loss of fluids from the body can cause dehydration.  CAUSES   Vomiting.  Diarrhea.  Excessive sweating.  Excessive urine output.  Fever. SYMPTOMS  Mild dehydration  Thirst.  Dry lips.  Slightly dry mouth. Moderate dehydration  Very dry mouth.  Sunken eyes.  Skin does not bounce back quickly when lightly pinched and released.  Dark urine and decreased urine production.  Decreased tear production.  Headache. Severe dehydration  Very dry mouth.  Extreme thirst.  Rapid, weak pulse (more than 100 beats per minute at rest).  Cold hands and feet.  Not able to sweat in spite of heat and temperature.  Rapid breathing.  Blue lips.  Confusion and lethargy.  Difficulty being awakened.  Minimal urine production.  No tears. DIAGNOSIS  Your caregiver will diagnose dehydration based on your symptoms and your exam. Blood and urine tests will help confirm the diagnosis. The diagnostic evaluation should also identify the cause of dehydration. TREATMENT  Treatment of mild or moderate dehydration can often be done at home by increasing the amount of fluids that you drink. It is best to drink small amounts of fluid more often. Drinking too much at one time can make vomiting worse. Refer to the home care instructions below. Severe dehydration needs to be treated at the hospital where you will probably be given intravenous (IV) fluids that contain water and electrolytes. HOME CARE INSTRUCTIONS   Ask your caregiver about specific rehydration instructions.  Drink enough fluids to keep your urine clear or pale yellow.  Drink small amounts frequently if you have nausea and vomiting.  Eat as you normally do.  Avoid:  Foods or drinks high in sugar.  Carbonated  drinks.  Juice.  Extremely hot or cold fluids.  Drinks with caffeine.  Fatty, greasy foods.  Alcohol.  Tobacco.  Overeating.  Gelatin desserts.  Wash your hands well to avoid spreading bacteria and viruses.  Only take over-the-counter or prescription medicines for pain, discomfort, or fever as directed by your caregiver.  Ask your caregiver if you should continue all prescribed and over-the-counter medicines.  Keep all follow-up appointments with your caregiver. SEEK MEDICAL CARE IF:  You have abdominal pain and it increases or stays in one area (localizes).  You have a rash, stiff neck, or severe headache.  You are irritable, sleepy, or difficult to awaken.  You are weak, dizzy, or extremely thirsty. SEEK IMMEDIATE MEDICAL CARE IF:   You are unable to keep fluids down or you get worse despite treatment.  You have frequent episodes of vomiting or diarrhea.  You have blood or green matter (bile) in your vomit.  You have blood in your stool or your stool looks black and tarry.  You have not urinated in 6 to 8 hours, or you have only urinated a small amount of very dark urine.  You have a fever.  You faint. MAKE SURE YOU:   Understand these instructions.  Will watch your condition.  Will get help right away if you are not doing well or get worse. Document Released: 10/02/2005 Document Revised: 12/25/2011 Document Reviewed: 05/22/2011 [employer] Patient Information 2015 [employer]. This information is not intended to replace advice given to you by your health care provider. Make sure you discuss any questions you have with your health care   provider.  

## 2015-01-29 ENCOUNTER — Other Ambulatory Visit (HOSPITAL_COMMUNITY)
Admission: RE | Admit: 2015-01-29 | Discharge: 2015-01-29 | Disposition: A | Discharge status: Home / Self Care | Payer: PRIVATE HEALTH INSURANCE | Source: Ambulatory Visit | Attending: Internal Medicine | Admitting: Internal Medicine

## 2015-01-29 ENCOUNTER — Ambulatory Visit (HOSPITAL_BASED_OUTPATIENT_CLINIC_OR_DEPARTMENT_OTHER): Payer: PRIVATE HEALTH INSURANCE | Admitting: Nurse Practitioner

## 2015-01-29 ENCOUNTER — Encounter: Payer: Self-pay | Admitting: Nurse Practitioner

## 2015-01-29 ENCOUNTER — Other Ambulatory Visit (HOSPITAL_BASED_OUTPATIENT_CLINIC_OR_DEPARTMENT_OTHER): Payer: PRIVATE HEALTH INSURANCE

## 2015-01-29 ENCOUNTER — Ambulatory Visit (HOSPITAL_BASED_OUTPATIENT_CLINIC_OR_DEPARTMENT_OTHER): Payer: PRIVATE HEALTH INSURANCE

## 2015-01-29 ENCOUNTER — Telehealth: Payer: Self-pay | Admitting: Internal Medicine

## 2015-01-29 VITALS — BP 164/94 | HR 66 | Resp 18

## 2015-01-29 DIAGNOSIS — R531 Weakness: Secondary | ICD-10-CM

## 2015-01-29 DIAGNOSIS — C3411 Malignant neoplasm of upper lobe, right bronchus or lung: Secondary | ICD-10-CM

## 2015-01-29 DIAGNOSIS — C7931 Secondary malignant neoplasm of brain: Secondary | ICD-10-CM | POA: Insufficient documentation

## 2015-01-29 DIAGNOSIS — G893 Neoplasm related pain (acute) (chronic): Secondary | ICD-10-CM

## 2015-01-29 DIAGNOSIS — R634 Abnormal weight loss: Secondary | ICD-10-CM

## 2015-01-29 DIAGNOSIS — B3781 Candidal esophagitis: Secondary | ICD-10-CM

## 2015-01-29 DIAGNOSIS — R630 Anorexia: Secondary | ICD-10-CM

## 2015-01-29 DIAGNOSIS — F17210 Nicotine dependence, cigarettes, uncomplicated: Secondary | ICD-10-CM

## 2015-01-29 DIAGNOSIS — E860 Dehydration: Secondary | ICD-10-CM | POA: Diagnosis not present

## 2015-01-29 DIAGNOSIS — R110 Nausea: Secondary | ICD-10-CM

## 2015-01-29 DIAGNOSIS — C3491 Malignant neoplasm of unspecified part of right bronchus or lung: Secondary | ICD-10-CM

## 2015-01-29 DIAGNOSIS — B370 Candidal stomatitis: Secondary | ICD-10-CM

## 2015-01-29 LAB — COMPREHENSIVE METABOLIC PANEL
ALBUMIN: 3.8 g/dL (ref 3.5–5.2)
ALK PHOS: 87 U/L (ref 39–117)
ALT: 32 U/L (ref 0–53)
ANION GAP: 10 (ref 5–15)
AST: 19 U/L (ref 0–37)
BUN: 21 mg/dL (ref 6–23)
CO2: 24 mmol/L (ref 19–32)
Calcium: 8.8 mg/dL (ref 8.4–10.5)
Chloride: 97 mmol/L (ref 96–112)
Creatinine, Ser: 0.68 mg/dL (ref 0.50–1.35)
GFR calc Af Amer: >90 mL/min (ref >90)
GFR calc non Af Amer: >90 mL/min (ref >90)
Glucose, Bld: 104 mg/dL — ABNORMAL HIGH (ref 70–99)
POTASSIUM: 3.9 mmol/L (ref 3.5–5.1)
Sodium: 131 mmol/L — ABNORMAL LOW (ref 135–145)
TOTAL PROTEIN: 6.6 g/dL (ref 6.0–8.3)
Total Bilirubin: 0.3 mg/dL (ref 0.3–1.2)

## 2015-01-29 LAB — CBC WITH DIFFERENTIAL/PLATELET
BASO%: 0.8 % (ref 0.0–2.0)
BASOS ABS: 0.1 10*3/uL (ref 0.0–0.1)
EOS ABS: 0 10*3/uL (ref 0.0–0.5)
EOS%: 0.1 % (ref 0.0–7.0)
HEMATOCRIT: 39.4 % (ref 38.4–49.9)
HEMOGLOBIN: 13.2 g/dL (ref 13.0–17.1)
LYMPH%: 2 % — AB (ref 14.0–49.0)
MCH: 31.7 pg (ref 27.2–33.4)
MCHC: 33.6 g/dL (ref 32.0–36.0)
MCV: 94.3 fL (ref 79.3–98.0)
MONO#: 0.3 10*3/uL (ref 0.1–0.9)
MONO%: 5.1 % (ref 0.0–14.0)
NEUT%: 92 % — ABNORMAL HIGH (ref 39.0–75.0)
NEUTROS ABS: 6 10*3/uL (ref 1.5–6.5)
PLATELETS: 129 10*3/uL — AB (ref 140–400)
RBC: 4.18 10*6/uL — ABNORMAL LOW (ref 4.20–5.82)
RDW: 15.7 % — ABNORMAL HIGH (ref 11.0–14.6)
WBC: 6.6 10*3/uL (ref 4.0–10.3)
lymph#: 0.1 10*3/uL — ABNORMAL LOW (ref 0.9–3.3)

## 2015-01-29 MED ORDER — SODIUM CHLORIDE 0.9 % IV SOLN
INTRAVENOUS | Status: DC
  Administered 2015-01-29: 10:00:00 via INTRAVENOUS

## 2015-01-29 MED ORDER — MAGIC MOUTHWASH: 5.0000 mL | Freq: Four times a day (QID) | ORAL | Status: AC | PRN

## 2015-01-29 MED ORDER — ONDANSETRON HCL 40 MG/20ML IJ SOLN
Freq: Once | INTRAMUSCULAR | Status: AC
  Administered 2015-01-29: 11:00:00 via INTRAVENOUS
  Filled 2015-01-29: qty 4

## 2015-01-29 NOTE — Patient Instructions (Signed)
Dehydration, Adult Dehydration is when you lose more fluids from the body than you take in. Vital organs like the kidneys, brain, and heart cannot function without a proper amount of fluids and salt. Any loss of fluids from the body can cause dehydration.  CAUSES   Vomiting.  Diarrhea.  Excessive sweating.  Excessive urine output.  Fever. SYMPTOMS  Mild dehydration  Thirst.  Dry lips.  Slightly dry mouth. Moderate dehydration  Very dry mouth.  Sunken eyes.  Skin does not bounce back quickly when lightly pinched and released.  Dark urine and decreased urine production.  Decreased tear production.  Headache. Severe dehydration  Very dry mouth.  Extreme thirst.  Rapid, weak pulse (more than 100 beats per minute at rest).  Cold hands and feet.  Not able to sweat in spite of heat and temperature.  Rapid breathing.  Blue lips.  Confusion and lethargy.  Difficulty being awakened.  Minimal urine production.  No tears. DIAGNOSIS  Your caregiver will diagnose dehydration based on your symptoms and your exam. Blood and urine tests will help confirm the diagnosis. The diagnostic evaluation should also identify the cause of dehydration. TREATMENT  Treatment of mild or moderate dehydration can often be done at home by increasing the amount of fluids that you drink. It is best to drink small amounts of fluid more often. Drinking too much at one time can make vomiting worse. Refer to the home care instructions below. Severe dehydration needs to be treated at the hospital where you will probably be given intravenous (IV) fluids that contain water and electrolytes. HOME CARE INSTRUCTIONS   Ask your caregiver about specific rehydration instructions.  Drink enough fluids to keep your urine clear or pale yellow.  Drink small amounts frequently if you have nausea and vomiting.  Eat as you normally do.  Avoid:  Foods or drinks high in sugar.  Carbonated  drinks.  Juice.  Extremely hot or cold fluids.  Drinks with caffeine.  Fatty, greasy foods.  Alcohol.  Tobacco.  Overeating.  Gelatin desserts.  Wash your hands well to avoid spreading bacteria and viruses.  Only take over-the-counter or prescription medicines for pain, discomfort, or fever as directed by your caregiver.  Ask your caregiver if you should continue all prescribed and over-the-counter medicines.  Keep all follow-up appointments with your caregiver. SEEK MEDICAL CARE IF:  You have abdominal pain and it increases or stays in one area (localizes).  You have a rash, stiff neck, or severe headache.  You are irritable, sleepy, or difficult to awaken.  You are weak, dizzy, or extremely thirsty. SEEK IMMEDIATE MEDICAL CARE IF:   You are unable to keep fluids down or you get worse despite treatment.  You have frequent episodes of vomiting or diarrhea.  You have blood or green matter (bile) in your vomit.  You have blood in your stool or your stool looks black and tarry.  You have not urinated in 6 to 8 hours, or you have only urinated a small amount of very dark urine.  You have a fever.  You faint. MAKE SURE YOU:   Understand these instructions.  Will watch your condition.  Will get help right away if you are not doing well or get worse. Document Released: 10/02/2005 Document Revised: 12/25/2011 Document Reviewed: 05/22/2011 [employer] Patient Information 2015 [employer]. This information is not intended to replace advice given to you by your health care provider. Make sure you discuss any questions you have with your health care   provider.  

## 2015-01-29 NOTE — Progress Notes (Signed)
Patient refusing to stay for complete infusion of IVF. Selena Lesser, NP notified and visiting patient at chairside. He wants to go home, has a "splitting headache." pt offered pain medication via IV here in infusion. He declines, stating he wants to go home and rest. PIV deaccessed and patient d/c'd home with family via wheelchair in no acute distress.

## 2015-01-29 NOTE — Assessment & Plan Note (Signed)
Patient complains intermittently of some mild nausea; but no vomiting.  He has Compazine at home; but states it is only minimally effective.  Prescribed Zofran ODT for the patient to try at home on an as-needed basis earlier this week as well.

## 2015-01-29 NOTE — Telephone Encounter (Signed)
Gave calendar and contrast for CT scan.

## 2015-01-29 NOTE — Progress Notes (Signed)
SYMPTOM MANAGEMENT CLINIC   HPI: Jason Keller 54 y.o. male diagnosed with lung cancer; with brain metastasis.  Also questionable metastasis to both the adrenals and bone.  Patient is status post whole brain irradiation completed on 11/24/2014..  Currently undergoing carboplatin/Alimta chemotherapy regimen.   Patient complaining of chronic nausea and very poor appetite.  He has had little oral intake; and is complaining of increased fatigue and progressive weakness.  Patient has had an 11-1/2 pound weight loss since his last weight check.    Patient has been coming back to the [place] on a daily basis this week for IV fluid rehydration.  He states that he feels slightly stronger today than earlier this week.  He did awake this morning with a headache.  Patient confirmed that he has increased his dexamethasone from 3 times per day up to 4 times per day.  He is also decreased his pain medication from 15 mg morphine tablets every 4 hours to morphine 7.5 mg every 4 hours.  He denies any diarrhea or constipation.  Patient denies any recent fevers or chills.  HPI  ROS  Past Medical History  Diagnosis Date  . Vertigo   . 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 Video bronchoscopy Bilateral 10/26/2014    Procedure: VIDEO BRONCHOSCOPY WITHOUT FLUORO;  Surgeon: Tanda Rockers, MD;  Location: [facility];  Service: Cardiopulmonary;  Laterality: Bilateral;    has Lung mass; Brain metastases; Non-small cell cancer of right lung; Neoplasm related pain; Nausea without vomiting; Anorexia; Dehydration; Weakness; and Weight loss on his problem list.    is allergic to oxycodone and penicillins.    Medication List       This list is accurate as of: 01/29/15 10:35 AM.  Always use your most recent  med list.               dexamethasone 4 MG tablet  Commonly known as:  DECADRON  Take 1 tablet (4 mg total) by mouth 2 (two) times daily.     docusate sodium 100 MG capsule  Commonly known as:  COLACE  Take 100 mg by mouth daily. 2 once day     emollient cream  Commonly known as:  BIAFINE  Apply topically as needed.     famotidine 20 MG tablet  Commonly known as:  PEPCID  One at bedtime     fluconazole 100 MG tablet  Commonly known as:  DIFLUCAN  Take 1 tablet (100 mg total) by mouth daily. Take 2 tablets (200 mg) on day 1, then take one tablet (100mg ) daily until complete     folic acid 1 MG tablet  Commonly known as:  FOLVITE  Take 1 tablet (1 mg total) by mouth daily.     HYDROcodone-acetaminophen 5-325 MG per tablet  Commonly known as:  NORCO/VICODIN  Take 1-2 tablets by mouth every 6 (six) hours as needed for moderate pain.     magic mouthwash Soln  Take 5 mLs by mouth 4 (four) times daily as needed for mouth pain ([facility] mouthwash w/ nystatin.).     meclizine 12.5 MG tablet  Commonly known as:  ANTIVERT  Take 12.5 mg by mouth 2 (two) times daily as needed for dizziness.     morphine 15 MG  tablet  Commonly known as:  MSIR  Take 1 tablet (15 mg total) by mouth every 4 (four) hours as needed for severe pain.     ondansetron 8 MG disintegrating tablet  Commonly known as:  ZOFRAN ODT  Take 1 tablet (8 mg total) by mouth every 8 (eight) hours as needed for nausea or vomiting.     pantoprazole 40 MG tablet  Commonly known as:  PROTONIX  Take 1 tablet (40 mg total) by mouth daily. Take 30-60 min before first meal of the day     polyethylene glycol packet  Commonly known as:  MIRALAX / GLYCOLAX  Take 17 g by mouth daily.     prochlorperazine 10 MG tablet  Commonly known as:  COMPAZINE  Take 1 tablet (10 mg total) by mouth every 6 (six) hours as needed for nausea or vomiting.     senna 8.6 MG tablet  Commonly known as:  SENOKOT  Take 2 tablets by mouth 2  (two) times daily.         PHYSICAL EXAMINATION  Oncology Vitals 01/28/2015 01/28/2015 01/28/2015 01/27/2015 01/27/2015 01/27/2015 01/26/2015  Height - - - - - - -  Weight - - - - - - -  Weight (lbs) - - - - - - -  BMI (kg/m2) - - - - - - -  Temp - - 98.4 97.6 97.8 97 -  Pulse 70 87 97 69 66 112 -  Resp - - 18 18 18 18  -  SpO2 - - - 99 - 98 100  BSA (m2) - - - - - - -   BP Readings from Last 3 Encounters:  01/28/15 155/92  01/27/15 159/76  01/26/15 136/87    Physical Exam  Constitutional: Vital signs are normal. He appears malnourished and dehydrated. He appears unhealthy. He appears cachectic.  Patient appears fatigued, weak, frail, and chronically ill.  HENT:  Head: Normocephalic and atraumatic.  Mouth/Throat: Oropharynx is clear and moist.  Eyes: Conjunctivae and EOM are normal. Pupils are equal, round, and reactive to light. Right eye exhibits no discharge. Left eye exhibits no discharge. No scleral icterus.  Neck: Normal range of motion. Neck supple. No JVD present. No tracheal deviation present. No thyromegaly present.  Cardiovascular: Normal heart sounds and intact distal pulses.   Tachycardic rate.  Pulmonary/Chest: Effort normal and breath sounds normal. No respiratory distress. He has no wheezes. He has no rales. He exhibits no tenderness.  Abdominal: Soft. Bowel sounds are normal. He exhibits no distension and no mass. There is no tenderness. There is no rebound and no guarding.  Musculoskeletal: Normal range of motion. He exhibits no edema or tenderness.  Lymphadenopathy:    He has no cervical adenopathy.  Neurological:  Patient will answer simple questions and follows simple commands only.  He appears slightly lethargic and very weak.  Skin: Skin is warm and dry. No rash noted. No erythema. There is pallor.  Psychiatric:  Flat affect.  Nursing note and vitals reviewed.   LABORATORY DATA:. Appointment on 01/29/2015  Component Date Value Ref Range Status  . WBC  01/29/2015 6.6  4.0 - 10.3 10e3/uL Final  . NEUT# 01/29/2015 6.0  1.5 - 6.5 10e3/uL Final  . HGB 01/29/2015 13.2  13.0 - 17.1 g/dL Final  . HCT 01/29/2015 39.4  38.4 - 49.9 % Final  . Platelets 01/29/2015 129* 140 - 400 10e3/uL Final  . MCV 01/29/2015 94.3  79.3 - 98.0 fL Final  . MCH 01/29/2015 31.7  27.2 - 33.4 pg Final  . MCHC 01/29/2015 33.6  32.0 - 36.0 g/dL Final  . RBC 01/29/2015 4.18* 4.20 - 5.82 10e6/uL Final  . RDW 01/29/2015 15.7* 11.0 - 14.6 % Final  . lymph# 01/29/2015 0.1* 0.9 - 3.3 10e3/uL Final  . MONO# 01/29/2015 0.3  0.1 - 0.9 10e3/uL Final  . Eosinophils Absolute 01/29/2015 0.0  0.0 - 0.5 10e3/uL Final  . Basophils Absolute 01/29/2015 0.1  0.0 - 0.1 10e3/uL Final  . NEUT% 01/29/2015 92.0* 39.0 - 75.0 % Final  . LYMPH% 01/29/2015 2.0* 14.0 - 49.0 % Final  . MONO% 01/29/2015 5.1  0.0 - 14.0 % Final  . EOS% 01/29/2015 0.1  0.0 - 7.0 % Final  . BASO% 01/29/2015 0.8  0.0 - 2.0 % Final     RADIOGRAPHIC STUDIES: No results found.  ASSESSMENT/PLAN:    Non-small cell cancer of right lung Patient completed whole brain irradiation on 11/24/2014. Patient completed cycle 3 of his carboplatin/Alimta chemotherapy regimen on 01/07/2015.  He was initially scheduled to receive cycle 4 of the same regimen on 01/28/2015; but the decision has been made to hold chemotherapy this week due to patient's progressive weakness and dehydration.  Dr. Julien Nordmann reviewed restaging brain MRI obtained on 01/08/2015 with both patient and family earlier this week.  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  Patient was advised to increase his dexamethasone 4 mg from 3 times per day up to 4 times per day.    Patient is feeling some better today; since he has been receiving daily IV fluid rehydration and increasing his dexamethasone to 4 times per day.  He does however, complain of a headache today.  Patient will obtain a restaging CT of the chest/abdomen/pelvis with contrast at the  beginning of next week; and then will follow-up with Dr. Julien Nordmann to review those scan results prior to making any further decisions regarding chemotherapy.           Neoplasm related pain Patient was taking morphine 15 mg tablets on an every four-hour basis earlier this week.  The family was concerned that patient may be over sedated due to his pain medication.  He has now decreased his pain medication to 7.5 mg morphine tablet every 4 hours; and this does appear to be managing his pain and allowing him to be less sedated.  Also, advised both patient and his family that patient's progressive symptoms may very well be due to his pain medication; but could also be secondary to dehydration or progression of disease.     Nausea without vomiting Patient complains intermittently of some mild nausea; but no vomiting.  He has Compazine at home; but states it is only minimally effective.  Prescribed Zofran ODT for the patient to try at home on an as-needed basis earlier this week as well.     Anorexia Patient has had minimal appetite; and continues to lose weight.  Most likely, this is a chemotherapy side effect; but could also be attributed to patient's chronic nausea.  Patient was encouraged to eat multiple small meals throughout the day if at all possible.     Dehydration Patient complains of some chronic nausea; but no vomiting.  He has minimal appetite; he continues to lose weight.  Patient has lost approximately 11-1/2 pounds since his last weight check.  Patient has been coming to the [facility] on a daily basis this week for IV fluid rehydration.  On exam today-patient does appear slightly less weak.  Patient  will once again receive IV fluid rehydration today.  He was also encouraged to push fluids at home as much as possible.     Weakness Patient is complaining of increased fatigue and weakness over the past few weeks.  He continues to complain of chronic nausea and minimal  appetite.  He has very poor oral intake.   These progressive symptoms most likely secondary to chemotherapy, dehydration,  and possible progression of his disease.      Patient stated understanding of all instructions; and was in agreement with this plan of care. The patient knows to call the clinic with any problems, questions or concerns.   This was a shared visit with Dr. Julien Nordmann today.  Total time spent with patient was 40 minutes;  with greater than 75 percent of that time spent in face to face counseling regarding patient's symptoms,  and coordination of care and follow up.  Disclaimer: This note was dictated with voice recognition software. Similar sounding words can inadvertently be transcribed and may not be corrected upon review.   Drue Second, NP 01/29/2015   ADDENDUM: Hematology/Oncology Attending: I had a face to face encounter with the patient. I recommended his care plan. This is a very pleasant 54 years old white male recently diagnosed with metastatic non-small cell lung cancer, adenocarcinoma with multiple brain metastasis. The patient status post whole brain irradiation as well as 3 cycles of systemic chemotherapy with carboplatin and Alimta. He has been complaining of increasing fatigue and weakness as well as lack of appetite and dehydration recently. The patient was started on daily IV hydration with normal saline for the last few days. He was also started on Decadron 4 mg by mouth 3 times a day but this was increased recently to 4 times a day. He notes some improvement in his condition but still complaining of increasing fatigue and weakness. I recommended for the patient to have repeat CT scan of the chest, abdomen and pelvis for restaging of his disease. His recent MRI of the brain showed questionable leptomeningeal disease. If the patient has any evidence for disease progression on the staging scan, we may consider him for palliative care and hospice referral. He  would come back for follow-up visit in one week for reevaluation after repeating the imaging studies. The patient was advised to call immediately if he has any concerning symptoms in the interval.  Disclaimer: This note was dictated with voice recognition software. Similar sounding words can inadvertently be transcribed and may not be corrected upon review. Eilleen Kempf., MD 01/31/2015

## 2015-01-29 NOTE — Assessment & Plan Note (Signed)
Patient was taking morphine 15 mg tablets on an every four-hour basis earlier this week.  The family was concerned that patient may be over sedated due to his pain medication.  He has now decreased his pain medication to 7.5 mg morphine tablet every 4 hours; and this does appear to be managing his pain and allowing him to be less sedated.  Also, advised both patient and his family that patient's progressive symptoms may very well be due to his pain medication; but could also be secondary to dehydration or progression of disease.

## 2015-01-29 NOTE — Assessment & Plan Note (Signed)
Patient completed whole brain irradiation on 11/24/2014. Patient completed cycle 3 of his carboplatin/Alimta chemotherapy regimen on 01/07/2015.  He was initially scheduled to receive cycle 4 of the same regimen on 01/28/2015; but the decision has been made to hold chemotherapy this week due to patient's progressive weakness and dehydration.  Dr. Julien Nordmann reviewed restaging brain MRI obtained on 01/08/2015 with both patient and family earlier this week.  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  Patient was advised to increase his dexamethasone 4 mg from 3 times per day up to 4 times per day.    Patient is feeling some better today; since he has been receiving daily IV fluid rehydration and increasing his dexamethasone to 4 times per day.  He does however, complain of a headache today.  Patient will obtain a restaging CT of the chest/abdomen/pelvis with contrast at the beginning of next week; and then will follow-up with Dr. Julien Nordmann to review those scan results prior to making any further decisions regarding chemotherapy.

## 2015-01-29 NOTE — Assessment & Plan Note (Signed)
Patient complains of some chronic nausea; but no vomiting.  He has minimal appetite; he continues to lose weight.  Patient has lost approximately 11-1/2 pounds since his last weight check.  Patient has been coming to the [facility] on a daily basis this week for IV fluid rehydration.  On exam today-patient does appear slightly less weak.  Patient will once again receive IV fluid rehydration today.  He was also encouraged to push fluids at home as much as possible.

## 2015-01-29 NOTE — Assessment & Plan Note (Signed)
Patient is complaining of increased fatigue and weakness over the past few weeks.  He continues to complain of chronic nausea and minimal appetite.  He has very poor oral intake.   These progressive symptoms most likely secondary to chemotherapy, dehydration,  and possible progression of his disease.

## 2015-01-29 NOTE — Assessment & Plan Note (Signed)
Patient has had minimal appetite; and continues to lose weight.  Most likely, this is a chemotherapy side effect; but could also be attributed to patient's chronic nausea.  Patient was encouraged to eat multiple small meals throughout the day if at all possible.

## 2015-02-01 ENCOUNTER — Inpatient Hospital Stay: Admission: RE | Admit: 2015-02-01 | Payer: PRIVATE HEALTH INSURANCE | Source: Ambulatory Visit

## 2015-02-01 ENCOUNTER — Ambulatory Visit (HOSPITAL_BASED_OUTPATIENT_CLINIC_OR_DEPARTMENT_OTHER): Payer: PRIVATE HEALTH INSURANCE | Admitting: Nurse Practitioner

## 2015-02-01 ENCOUNTER — Inpatient Hospital Stay (HOSPITAL_COMMUNITY)
Admission: AD | Admit: 2015-02-01 | Discharge: 2015-02-08 | DRG: 054 | Disposition: A | Discharge status: Hospice | Payer: PRIVATE HEALTH INSURANCE | Source: Ambulatory Visit | Attending: Family Medicine | Admitting: Family Medicine

## 2015-02-01 ENCOUNTER — Encounter (HOSPITAL_COMMUNITY): Payer: Self-pay

## 2015-02-01 ENCOUNTER — Other Ambulatory Visit: Payer: Self-pay | Admitting: Nurse Practitioner

## 2015-02-01 DIAGNOSIS — Z66 Do not resuscitate: Secondary | ICD-10-CM | POA: Diagnosis not present

## 2015-02-01 DIAGNOSIS — R531 Weakness: Secondary | ICD-10-CM

## 2015-02-01 DIAGNOSIS — Z885 Allergy status to narcotic agent status: Secondary | ICD-10-CM | POA: Diagnosis not present

## 2015-02-01 DIAGNOSIS — R630 Anorexia: Secondary | ICD-10-CM | POA: Diagnosis present

## 2015-02-01 DIAGNOSIS — E43 Unspecified severe protein-calorie malnutrition: Secondary | ICD-10-CM | POA: Diagnosis present

## 2015-02-01 DIAGNOSIS — Z515 Encounter for palliative care: Secondary | ICD-10-CM

## 2015-02-01 DIAGNOSIS — Z880 Allergy status to penicillin: Secondary | ICD-10-CM | POA: Diagnosis not present

## 2015-02-01 DIAGNOSIS — R404 Transient alteration of awareness: Secondary | ICD-10-CM

## 2015-02-01 DIAGNOSIS — F05 Delirium due to known physiological condition: Secondary | ICD-10-CM

## 2015-02-01 DIAGNOSIS — Z681 Body mass index (BMI) 19 or less, adult: Secondary | ICD-10-CM | POA: Diagnosis not present

## 2015-02-01 DIAGNOSIS — C7949 Secondary malignant neoplasm of other parts of nervous system: Secondary | ICD-10-CM

## 2015-02-01 DIAGNOSIS — R110 Nausea: Secondary | ICD-10-CM | POA: Diagnosis present

## 2015-02-01 DIAGNOSIS — E876 Hypokalemia: Secondary | ICD-10-CM | POA: Diagnosis present

## 2015-02-01 DIAGNOSIS — R339 Retention of urine, unspecified: Secondary | ICD-10-CM | POA: Diagnosis not present

## 2015-02-01 DIAGNOSIS — C3491 Malignant neoplasm of unspecified part of right bronchus or lung: Secondary | ICD-10-CM

## 2015-02-01 DIAGNOSIS — C7931 Secondary malignant neoplasm of brain: Principal | ICD-10-CM | POA: Diagnosis present

## 2015-02-01 DIAGNOSIS — E860 Dehydration: Secondary | ICD-10-CM | POA: Diagnosis present

## 2015-02-01 DIAGNOSIS — R509 Fever, unspecified: Secondary | ICD-10-CM

## 2015-02-01 DIAGNOSIS — W19XXXA Unspecified fall, initial encounter: Secondary | ICD-10-CM | POA: Diagnosis present

## 2015-02-01 DIAGNOSIS — R627 Adult failure to thrive: Secondary | ICD-10-CM | POA: Diagnosis present

## 2015-02-01 DIAGNOSIS — Z79891 Long term (current) use of opiate analgesic: Secondary | ICD-10-CM | POA: Diagnosis not present

## 2015-02-01 DIAGNOSIS — G9340 Encephalopathy, unspecified: Secondary | ICD-10-CM | POA: Diagnosis present

## 2015-02-01 DIAGNOSIS — F17210 Nicotine dependence, cigarettes, uncomplicated: Secondary | ICD-10-CM | POA: Diagnosis present

## 2015-02-01 DIAGNOSIS — Z79899 Other long term (current) drug therapy: Secondary | ICD-10-CM

## 2015-02-01 DIAGNOSIS — R634 Abnormal weight loss: Secondary | ICD-10-CM | POA: Diagnosis present

## 2015-02-01 DIAGNOSIS — Z808 Family history of malignant neoplasm of other organs or systems: Secondary | ICD-10-CM

## 2015-02-01 DIAGNOSIS — R410 Disorientation, unspecified: Secondary | ICD-10-CM | POA: Diagnosis not present

## 2015-02-01 DIAGNOSIS — G893 Neoplasm related pain (acute) (chronic): Secondary | ICD-10-CM | POA: Diagnosis present

## 2015-02-01 DIAGNOSIS — Z7952 Long term (current) use of systemic steroids: Secondary | ICD-10-CM | POA: Diagnosis not present

## 2015-02-01 LAB — CBC
HCT: 37.9 % — ABNORMAL LOW (ref 39.0–52.0)
HEMOGLOBIN: 13.5 g/dL (ref 13.0–17.0)
MCH: 32.6 pg (ref 26.0–34.0)
MCHC: 35.6 g/dL (ref 30.0–36.0)
MCV: 91.5 fL (ref 78.0–100.0)
Platelets: 122 10*3/uL — ABNORMAL LOW (ref 150–400)
RBC: 4.14 MIL/uL — ABNORMAL LOW (ref 4.22–5.81)
RDW: 14.9 % (ref 11.5–15.5)
WBC: 7.2 10*3/uL (ref 4.0–10.5)

## 2015-02-01 LAB — COMPREHENSIVE METABOLIC PANEL
ALBUMIN: 3.8 g/dL (ref 3.5–5.2)
ALK PHOS: 102 U/L (ref 39–117)
ALT: 37 U/L (ref 0–53)
AST: 24 U/L (ref 0–37)
Anion gap: 13 (ref 5–15)
BUN: 24 mg/dL — ABNORMAL HIGH (ref 6–23)
CO2: 27 mmol/L (ref 19–32)
Calcium: 8.8 mg/dL (ref 8.4–10.5)
Chloride: 94 mmol/L — ABNORMAL LOW (ref 96–112)
Creatinine, Ser: 0.62 mg/dL (ref 0.50–1.35)
GFR calc Af Amer: >90 mL/min (ref >90)
GFR calc non Af Amer: >90 mL/min (ref >90)
GLUCOSE: 93 mg/dL (ref 70–99)
Potassium: 3.5 mmol/L (ref 3.5–5.1)
Sodium: 134 mmol/L — ABNORMAL LOW (ref 135–145)
Total Bilirubin: 1 mg/dL (ref 0.3–1.2)
Total Protein: 6.6 g/dL (ref 6.0–8.3)

## 2015-02-01 LAB — MAGNESIUM: Magnesium: 2 mg/dL (ref 1.5–2.5)

## 2015-02-01 LAB — TSH: TSH: 2.002 u[IU]/mL (ref 0.350–4.500)

## 2015-02-01 MED ORDER — LORAZEPAM 1 MG PO TABS
1.0000 mg | ORAL_TABLET | Freq: Once | ORAL | Status: AC
  Administered 2015-02-01: 1 mg via ORAL
  Filled 2015-02-01: qty 1

## 2015-02-01 MED ORDER — ACETAMINOPHEN 325 MG PO TABS: 650.0000 mg | ORAL_TABLET | Freq: Four times a day (QID) | ORAL | Status: DC | PRN

## 2015-02-01 MED ORDER — ONDANSETRON HCL 4 MG/2ML IJ SOLN: 4.0000 mg | Freq: Four times a day (QID) | INTRAMUSCULAR | Status: DC | PRN

## 2015-02-01 MED ORDER — MORPHINE SULFATE 15 MG PO TABS: 15.0000 mg | ORAL_TABLET | ORAL | Status: DC | PRN

## 2015-02-01 MED ORDER — ACETAMINOPHEN 650 MG RE SUPP
650.0000 mg | Freq: Four times a day (QID) | RECTAL | Status: DC | PRN
  Administered 2015-02-03: 650 mg via RECTAL
  Filled 2015-02-01: qty 1

## 2015-02-01 MED ORDER — FAMOTIDINE 20 MG PO TABS
20.0000 mg | ORAL_TABLET | Freq: Every day | ORAL | Status: DC
  Filled 2015-02-01: qty 1

## 2015-02-01 MED ORDER — DEXTROSE-NACL 5-0.9 % IV SOLN
INTRAVENOUS | Status: DC
  Administered 2015-02-01 – 2015-02-08 (×13): via INTRAVENOUS

## 2015-02-01 MED ORDER — PANTOPRAZOLE SODIUM 40 MG IV SOLR
40.0000 mg | INTRAVENOUS | Status: DC
  Administered 2015-02-01 – 2015-02-07 (×7): 40 mg via INTRAVENOUS
  Filled 2015-02-01 (×8): qty 40

## 2015-02-01 MED ORDER — DEXAMETHASONE SODIUM PHOSPHATE 4 MG/ML IJ SOLN
4.0000 mg | Freq: Four times a day (QID) | INTRAMUSCULAR | Status: DC
  Administered 2015-02-02 – 2015-02-08 (×26): 4 mg via INTRAVENOUS
  Filled 2015-02-01 (×31): qty 1

## 2015-02-01 MED ORDER — FOLIC ACID 1 MG PO TABS
1.0000 mg | ORAL_TABLET | Freq: Every day | ORAL | Status: DC
  Filled 2015-02-01: qty 1

## 2015-02-01 MED ORDER — HALOPERIDOL LACTATE 5 MG/ML IJ SOLN: 5.0000 mg | Freq: Four times a day (QID) | INTRAMUSCULAR | Status: DC | PRN

## 2015-02-01 MED ORDER — ENOXAPARIN SODIUM 40 MG/0.4ML ~~LOC~~ SOLN
40.0000 mg | SUBCUTANEOUS | Status: DC
  Administered 2015-02-01 – 2015-02-07 (×6): 40 mg via SUBCUTANEOUS
  Filled 2015-02-01 (×8): qty 0.4

## 2015-02-01 MED ORDER — LORAZEPAM 2 MG/ML IJ SOLN
1.0000 mg | Freq: Four times a day (QID) | INTRAMUSCULAR | Status: DC | PRN
  Administered 2015-02-01 – 2015-02-04 (×6): 1 mg via INTRAVENOUS
  Filled 2015-02-01 (×6): qty 1

## 2015-02-01 MED ORDER — MORPHINE SULFATE 2 MG/ML IJ SOLN
2.0000 mg | INTRAMUSCULAR | Status: DC | PRN
  Administered 2015-02-02 – 2015-02-04 (×8): 2 mg via INTRAVENOUS
  Filled 2015-02-01 (×8): qty 1

## 2015-02-01 MED ORDER — PANTOPRAZOLE SODIUM 40 MG PO TBEC: 40.0000 mg | DELAYED_RELEASE_TABLET | Freq: Every day | ORAL | Status: DC

## 2015-02-01 MED ORDER — DEXAMETHASONE 4 MG PO TABS
4.0000 mg | ORAL_TABLET | Freq: Four times a day (QID) | ORAL | Status: DC
  Filled 2015-02-01 (×2): qty 1

## 2015-02-01 MED ORDER — ONDANSETRON HCL 4 MG PO TABS: 4.0000 mg | ORAL_TABLET | Freq: Four times a day (QID) | ORAL | Status: DC | PRN

## 2015-02-01 MED ORDER — FAMOTIDINE IN NACL 20-0.9 MG/50ML-% IV SOLN
20.0000 mg | INTRAVENOUS | Status: DC
  Administered 2015-02-01 – 2015-02-07 (×7): 20 mg via INTRAVENOUS
  Filled 2015-02-01 (×7): qty 50

## 2015-02-01 NOTE — H&P (Signed)
History and Physical  Jason Keller WSF:[id number] DOB: [birth year] DOA: 02/01/2015   PCP: No PCP Per Patient   Chief Complaint: FTT, dehydration  HPI:   54 y.o. male diagnosed with lung cancer; with brain metastasis. Also questionable metastasis to both the adrenals and bone. Patient is status post whole brain irradiation completed on 11/24/2014. The patient has also had radiation therapy to his left flank/hip area with the last treatment on 01/06/2015. His last cycle of chemotherapy which 01/07/2015. His fourth cycle of chemotherapy was scheduled for 01/28/2015, but has been canceled due to the patient's decline clinically. Since the patient began whole brain radiation, he has been less and less conversant to the point where he only states a few words. He is able to verbalize his needs. The patient has had anorexia with chronic nausea. There has not been any vomiting, diarrhea, fevers, chills, chest pain, shortness breath, dysuria. The patient is unable to provide any reliable history. All of this history is obtained per review of medical records and speaking with the patient's mother and girlfriend at the bedside. For the week beginning 01/25/2015, the patient visited the [facility] daily to receive intravenous fluids. However, the patient has gotten more agitated to the point where he refused IV access on his last visit. Patient's family confirmed that his dexamethasone has been increased from 4 mg 3 times a day 24 times daily which has helped him a little. The family has decreased his morphine IR  to 7.5 mg every 4 hours because of somnolence.   last week, the patient suffered with constipation. He was instructed to take MiraLAX every 4 hours which has resulted in loose stools in the past 24 hours. The patient tried to get up to go to the bathroom on the evening of 01/31/2015 and had a mechanical fall. The patient was not able to get to the bathroom in time and resulted in bowel  incontinence. There has not been any hematochezia, hemoptysis, headache, visual disturbance. The patient has also had increase in agitation, and he has been refusing to follow commands. The patient was directly admitted from Dr. Worthy Flank office for failure to thrive and dehydration.  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 Worthy Flank list -continue decadron       Past Medical History  Diagnosis Date  . Vertigo   . 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 Video bronchoscopy Bilateral 10/26/2014    Procedure: VIDEO BRONCHOSCOPY WITHOUT FLUORO;  Surgeon: Tanda Rockers, MD;  Location: [facility];  Service: Cardiopulmonary;  Laterality: Bilateral;   Social History:  reports that he has been smoking Cigarettes.  He has a 37 pack-year smoking history. He has never used smokeless tobacco. He reports that he drinks alcohol. He reports that he does not use illicit drugs.   Family History  Problem Relation Age of Onset  . Cancer Paternal Grandmother  throat ca     Allergies  Allergen Reactions  . Oxycodone Rash  . Penicillins Rash      Prior to Admission medications   Medication Sig Start Date End Date Taking? Authorizing Provider  Alum & Mag Hydroxide-Simeth (MAGIC MOUTHWASH) SOLN Take 5 mLs by mouth 4 (four) times daily as needed for mouth pain ([facility]  mouthwash w/ nystatin.). 01/29/15  Yes Susanne Borders, NP  dexamethasone (DECADRON) 4 MG tablet Take 1 tablet (4 mg total) by mouth 2 (two) times daily. Patient taking differently: Take 4 mg by mouth 4 (four) times daily.  12/17/14  Yes Thea Silversmith, MD  famotidine (PEPCID) 20 MG tablet One at bedtime 10/14/14  Yes Tanda Rockers, MD  folic acid (FOLVITE) 1 MG tablet Take 1 tablet (1 mg total) by mouth daily. 11/11/14  Yes Curt Bears, MD  morphine (MSIR) 15 MG tablet Take 1 tablet (15 mg total) by mouth every 4 (four) hours as needed for severe pain. 01/12/15  Yes Eppie Gibson, MD  pantoprazole (PROTONIX) 40 MG tablet Take 1 tablet (40 mg total) by mouth daily. Take 30-60 min before first meal of the day 10/14/14  Yes Tanda Rockers, MD  polyethylene glycol [facility] / GLYCOLAX) packet Take 17 g by mouth 3 (three) times daily.    Yes Historical Provider, MD  prochlorperazine (COMPAZINE) 10 MG tablet Take 1 tablet (10 mg total) by mouth every 6 (six) hours as needed for nausea or vomiting. 12/31/14  Yes Curt Bears, MD  senna (SENOKOT) 8.6 MG tablet Take 2 tablets by mouth 2 (two) times daily.   Yes Historical Provider, MD  fluconazole (DIFLUCAN) 100 MG tablet Take 1 tablet (100 mg total) by mouth daily. Take 2 tablets (200 mg) on day 1, then take one tablet (100mg ) daily until complete Patient not taking: Reported on 02/01/2015 01/08/15   Carlton Adam, PA-C  HYDROcodone-acetaminophen (NORCO/VICODIN) 5-325 MG per tablet Take 1-2 tablets by mouth every 6 (six) hours as needed for moderate pain. Patient not taking: Reported on 02/01/2015 12/21/14   Tyler Pita, MD  ondansetron (ZOFRAN ODT) 8 MG disintegrating tablet Take 1 tablet (8 mg total) by mouth every 8 (eight) hours as needed for nausea or vomiting. Patient not taking: Reported on 02/01/2015 01/27/15   Susanne Borders, NP    Review of Systems:  Unobtainable secondary to patient's mental status   Physical Exam: Filed Vitals:    02/01/15 1652  BP: 145/93  Pulse: 80  Temp: 98.4 F (36.9 C)  TempSrc: Oral  Resp: 18  Height: 5\' 9"  (1.753 m)  Weight: 53.978 kg (119 lb)  SpO2: 100%   General:  Awake and alert, NAD, nontoxic, pleasant/cooperative; follows one step commands Head/Eye: No conjunctival hemorrhage, no icterus, [place]/AT, No nystagmus ENT:  No icterus,  No thrush,  no pharyngeal exudate Neck:  No masses, no lymphadenpathy, no bruits CV:  RRR, no rub, no gallop, no S3 Lung:  CTAB, good air movement, no wheeze, no rhonchi Abdomen: soft/NT, +BS, nondistended, no peritoneal signs Ext: No cyanosis, No rashes, No petechiae, No lymphangitis, No edema   Labs on Admission:  Basic Metabolic Panel:  Recent Labs Lab 01/26/15 1309 01/29/15 1002  NA 134* 131*  K 3.8 3.9  CL  --  97  CO2 23 24  GLUCOSE 95 104*  BUN 20.5 21  CREATININE 0.7 0.68  CALCIUM 8.6 8.8   Liver Function Tests:  Recent Labs Lab 01/26/15 1309 01/29/15 1002  AST 19  19  ALT 36 32  ALKPHOS 101 87  BILITOT 0.70 0.3  PROT 6.5 6.6  ALBUMIN 3.5 3.8   No results for input(s): LIPASE, AMYLASE in the last 168 hours. No results for input(s): AMMONIA in the last 168 hours. CBC:  Recent Labs Lab 01/26/15 1308 01/29/15 0905  WBC 5.4 6.6  NEUTROABS 4.7 6.0  HGB 14.8 13.2  HCT 41.4 39.4  MCV 91.8 94.3  PLT 101* 129*   Cardiac Enzymes: No results for input(s): CKTOTAL, CKMB, CKMBINDEX, TROPONINI in the last 168 hours. BNP: Invalid input(s): POCBNP CBG: No results for input(s): GLUCAP in the last 168 hours.  Radiological Exams on Admission: No results found.  EKG: Independently reviewed. pending    Time spent:60 minutes Code Status:   FULL Family Communication:   Family updated at bedside   Jason Vento, DO  [facility] Pager [phone]  If 7PM-7AM, please contact night-coverage [email or web address] Password [facility] 02/01/2015, 6:49 PM

## 2015-02-02 ENCOUNTER — Inpatient Hospital Stay: Admission: RE | Admit: 2015-02-02 | Payer: PRIVATE HEALTH INSURANCE | Source: Ambulatory Visit

## 2015-02-02 ENCOUNTER — Other Ambulatory Visit (HOSPITAL_COMMUNITY): Payer: PRIVATE HEALTH INSURANCE

## 2015-02-02 DIAGNOSIS — Z515 Encounter for palliative care: Secondary | ICD-10-CM | POA: Insufficient documentation

## 2015-02-02 DIAGNOSIS — G893 Neoplasm related pain (acute) (chronic): Secondary | ICD-10-CM | POA: Insufficient documentation

## 2015-02-02 DIAGNOSIS — R634 Abnormal weight loss: Secondary | ICD-10-CM

## 2015-02-02 DIAGNOSIS — R630 Anorexia: Secondary | ICD-10-CM

## 2015-02-02 DIAGNOSIS — C7931 Secondary malignant neoplasm of brain: Principal | ICD-10-CM

## 2015-02-02 DIAGNOSIS — R410 Disorientation, unspecified: Secondary | ICD-10-CM

## 2015-02-02 DIAGNOSIS — G9340 Encephalopathy, unspecified: Secondary | ICD-10-CM | POA: Insufficient documentation

## 2015-02-02 LAB — BASIC METABOLIC PANEL
Anion gap: 8 (ref 5–15)
BUN: 19 mg/dL (ref 6–23)
CALCIUM: 8.2 mg/dL — AB (ref 8.4–10.5)
CO2: 26 mmol/L (ref 19–32)
Chloride: 99 mmol/L (ref 96–112)
Creatinine, Ser: 0.56 mg/dL (ref 0.50–1.35)
GFR calc Af Amer: >90 mL/min (ref >90)
GLUCOSE: 117 mg/dL — AB (ref 70–99)
POTASSIUM: 3.2 mmol/L — AB (ref 3.5–5.1)
Sodium: 133 mmol/L — ABNORMAL LOW (ref 135–145)

## 2015-02-02 LAB — URINALYSIS, ROUTINE W REFLEX MICROSCOPIC
Glucose, UA: NEGATIVE mg/dL
Hgb urine dipstick: NEGATIVE
KETONES UR: NEGATIVE mg/dL
LEUKOCYTES UA: NEGATIVE
Nitrite: NEGATIVE
PROTEIN: NEGATIVE mg/dL
Specific Gravity, Urine: 1.023 (ref 1.005–1.030)
Urobilinogen, UA: 1 mg/dL (ref 0.0–1.0)
pH: 6.5 (ref 5.0–8.0)

## 2015-02-02 LAB — CORTISOL-AM, BLOOD: Cortisol - AM: 5.6 ug/dL (ref 4.3–22.4)

## 2015-02-02 LAB — VITAMIN B12: VITAMIN B 12: 1308 pg/mL — AB (ref 211–911)

## 2015-02-02 MED ORDER — CAMPHOR-MENTHOL 0.5-0.5 % EX LOTN
TOPICAL_LOTION | CUTANEOUS | Status: DC | PRN
  Administered 2015-02-04: 12:00:00 via TOPICAL
  Filled 2015-02-02: qty 222

## 2015-02-02 MED ORDER — DIPHENHYDRAMINE HCL 50 MG/ML IJ SOLN
12.5000 mg | Freq: Three times a day (TID) | INTRAMUSCULAR | Status: DC | PRN
  Administered 2015-02-02: 12.5 mg via INTRAVENOUS
  Filled 2015-02-02: qty 1

## 2015-02-02 MED ORDER — HALOPERIDOL LACTATE 5 MG/ML IJ SOLN
2.0000 mg | Freq: Four times a day (QID) | INTRAMUSCULAR | Status: DC | PRN
  Administered 2015-02-03 – 2015-02-06 (×3): 5 mg via INTRAVENOUS
  Administered 2015-02-06: 2.5 mg via INTRAVENOUS
  Administered 2015-02-07 – 2015-02-08 (×4): 5 mg via INTRAVENOUS
  Filled 2015-02-02 (×8): qty 1

## 2015-02-02 MED ORDER — WHITE PETROLATUM GEL
Status: DC | PRN
  Filled 2015-02-02: qty 28.35

## 2015-02-02 NOTE — Consult Note (Signed)
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 Admitted with FTT, decreased PO intake, confusion  1.  Code Status: Full documented  2. GOC: Met with family today. Very somber mood this afternoon.  [name]) in particular was shocked to hear the idea of hospice and comfort care.  It appeared as though some of the other family was handling this better.  Jason Keller has some acknowledgement about comfort being a priority going forward, but she also has a very difficult time talking about what to expect or how to handle his care going forward.  We talked about hospice transition including facility, but she is not fond of hospice care.  She alludes to a bad experience that a friend had at hospice in [place].  Jason Keller states she was told by Oncology that he could stay here a few days and didn't want to think about anything else currently.  I think she is devestated by news today and having some anticipatory grieving.  I did not push issues today, and will follow-up tomorrow morning when she has had more time to process things.  I was able to talk with one of the children about what to expect going forward, hospice, importance of code status discussions, etc.  I am hoping she is able to bridge some of this conversation with family.  I am hoping by giving them some emotional space, that we will have a more productive discussion tomorrow.    3. Symptom Management:   1. Cancer Related Pain: On low dose morphine IV, it seems he was pretty sensitive to oral doses and current dosing seems reasonable.  Will monitor 2. Acute Encephalopathy: ativan and haldol ordered PRN. Would be cautious with ativan  which can make delirium worse.  I have gicen lower dose range of haldol as he may not need $Remove'5mg'mcQQHgM$  dose to control symptoms.    4. Psychosocial/Spiritual: Divorced, lives in [place] with his girlfriend. Wife, children, girlfriend, ex-wife present today.     Brief HPI: 54 yo male with PMHx of Stage IV NSCLC (adenoCA) with known brain mets and question of adrenal/bone mets.  He is currently encephalopathic so history obtained from chart and family at bedside.  He reportedly was diagnosed with cancer in late December and began treatment in January.  He has undergone WBRT and radiation therapy to back per family in addition to undergoing systemic chemotherapy.  They feel like he has had significant decline after second round of radiation to back which finished in March.  He has been having declining functional status, rapidly declining PO intake, increasing confusion, increased somnolence which they were concerned was related to [facility] Keller tabs.  Reportedly when he took entire tab, would sleep for 8hrs straight. They have been cutting pills in half, but continued decline noted.  He was getting daily outpatient IV fluids through the [facility] recently as well.  He was also noted to have fall on 4/17.  He was admitted with failure to thrive and given recent imaging for leptomeningeal spread, it is felt his acute encephalopathy and declining functional status are almost certainly related to this.  He  and family were seen by Dr Jason Keller today who recommended hospice/comfort care and I was asked to further evaluate.    ROS: Unable to obtain 2/2 acute encephalopathy    PMH:  Past Medical History  Diagnosis Date  . Vertigo   . 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 Video bronchoscopy Bilateral 10/26/2014     Procedure: VIDEO BRONCHOSCOPY WITHOUT FLUORO;  Surgeon: Tanda Rockers, MD;  Location: [facility];  Service: Cardiopulmonary;  Laterality: Bilateral;   I have reviewed the [place] and [facility] and  If appropriate update it with new information. Allergies  Allergen Reactions  . Oxycodone Rash  . Penicillins Rash   Scheduled Meds: . dexamethasone  4 mg Intravenous 4 times per day  . enoxaparin (LOVENOX) injection  40 mg Subcutaneous Q24H  . famotidine (PEPCID) IV  20 mg Intravenous Q24H  . pantoprazole (PROTONIX) IV  40 mg Intravenous Q24H   Continuous Infusions: . dextrose 5 % and 0.9% NaCl 100 mL/hr at 02/02/15 0411   PRN Meds:.acetaminophen **OR** acetaminophen, haloperidol lactate, LORazepam, morphine injection, [DISCONTINUED] ondansetron **OR** ondansetron (ZOFRAN) IV    BP 155/88 mmHg  Pulse 73  Temp(Src) 97.5 F (36.4 C) (Axillary)  Resp 18  Ht $R'5\' 9"'aG$  (1.753 m)  Wt 53.978 kg (119 lb)  BMI 17.57 kg/m2  SpO2 98%   PPS: 10-20   Intake/Output Summary (Last 24 hours) at 02/02/15 1632 Last data filed at 02/02/15 1230  Gross per 24 hour  Intake 1108.33 ml  Output    450 ml  Net 658.33 ml   Physical Exam:  General: Confused, NAD Neuro: occasional fidgeting and pulling at sheets.   Labs: CBC    Component Value Date/Time   WBC 7.2 02/01/2015 1932   WBC 6.6 01/29/2015 0905   RBC 4.14* 02/01/2015 1932   RBC 4.18* 01/29/2015 0905   HGB 13.5 02/01/2015 1932   HGB 13.2 01/29/2015 0905   HCT 37.9* 02/01/2015 1932   HCT 39.4 01/29/2015 0905   PLT 122* 02/01/2015 1932   PLT 129* 01/29/2015 0905   MCV 91.5 02/01/2015 1932   MCV 94.3 01/29/2015 0905   MCH 32.6 02/01/2015 1932   MCH 31.7 01/29/2015 0905   MCHC 35.6 02/01/2015 1932   MCHC 33.6 01/29/2015 0905   RDW 14.9 02/01/2015 1932   RDW 15.7* 01/29/2015 0905   LYMPHSABS 0.1* 01/29/2015 0905   LYMPHSABS 2.0 10/08/2014 2004   MONOABS 0.3 01/29/2015 0905   MONOABS 0.5 10/08/2014 2004   EOSABS 0.0 01/29/2015 0905    EOSABS 0.2 10/08/2014 2004   BASOSABS 0.1 01/29/2015 0905   BASOSABS 0.0 10/08/2014 2004    BMET    Component Value Date/Time   NA 133* 02/02/2015 0443   NA 134* 01/26/2015 1309   K 3.2* 02/02/2015 0443   K 3.8 01/26/2015 1309   CL 99 02/02/2015 0443   CO2 26 02/02/2015 0443   CO2 23 01/26/2015 1309   GLUCOSE 117* 02/02/2015 0443   GLUCOSE 95 01/26/2015 1309   BUN 19 02/02/2015 0443   BUN 20.5 01/26/2015 1309   CREATININE 0.56 02/02/2015 0443   CREATININE 0.7 01/26/2015 1309   CALCIUM 8.2* 02/02/2015 0443   CALCIUM 8.6 01/26/2015 1309   GFRNONAA >90 02/02/2015 0443   GFRAA >90 02/02/2015 0443  CMP     Component Value Date/Time   NA 133* 02/02/2015 0443   NA 134* 01/26/2015 1309   K 3.2* 02/02/2015 0443   K 3.8 01/26/2015 1309   CL 99 02/02/2015 0443   CO2 26 02/02/2015 0443   CO2 23 01/26/2015 1309   GLUCOSE 117* 02/02/2015 0443   GLUCOSE 95 01/26/2015 1309   BUN 19 02/02/2015 0443   BUN 20.5 01/26/2015 1309   CREATININE 0.56 02/02/2015 0443   CREATININE 0.7 01/26/2015 1309   CALCIUM 8.2* 02/02/2015 0443   CALCIUM 8.6 01/26/2015 1309   PROT 6.6 02/01/2015 1932   PROT 6.5 01/26/2015 1309   ALBUMIN 3.8 02/01/2015 1932   ALBUMIN 3.5 01/26/2015 1309   AST 24 02/01/2015 1932   AST 19 01/26/2015 1309   ALT 37 02/01/2015 1932   ALT 36 01/26/2015 1309   ALKPHOS 102 02/01/2015 1932   ALKPHOS 101 01/26/2015 1309   BILITOT 1.0 02/01/2015 1932   BILITOT 0.70 01/26/2015 1309   GFRNONAA >90 02/02/2015 0443   GFRAA >90 02/02/2015 0443   11/09/14  IMPRESSION: 1. Hypermetabolic right perihilar mass consists with primary bronchogenic carcinoma. 2. Nodular airspace disease peripheral to the right upper lobe mass representing postobstructive pneumonitis versus tumor spread. 3. Enlarged hypermetabolic left adrenal metastasis. 4. Hypermetabolic muscular metastasis in the left paravertebral musculature and right shoulder musculature. 5. Hypermetabolic focus adjacent  head of pancreas likely represents a metastatic lymph node. 6. Brain metastasis better evaluated on MRI.   3/25 MRI Brain IMPRESSION: 1. All previously identified brain metastases have regressed. Two are no longer enhancing. Associated edema has regressed. 2. Suggestion of new abnormal bilateral 5th and 7th/8th cisternal cranial nerve enhancement, such as due to new leptomeningeal metastatic disease. However, no other leptomeningeal enhancement is identified. This would be an unlikely artifactual appearance, but consider CSF analysis to confirm or refute the presence of leptomeningeal disease. 3. New mild T2 and FLAIR hyperintensity surrounding the fourth ventricle, without associated enhancement or evidence of ventricular obstruction. Etiology is unclear, perhaps this relates to whole brain radiation.   Total Time: 80 minutes Greater than 50%  of this time was spent counseling and coordinating care related to the above assessment and plan.  Doran Clay D.O. Palliative Medicine Team at [facility]  Pager: [phone] Team Phone: [phone]

## 2015-02-02 NOTE — Progress Notes (Signed)
DIAGNOSIS: Stage IV (T2a, N0, M1b) non-small cell lung cancer, adenocarcinoma, with negative EGFR mutation and negative ALK gene translocation presented with large right upper lobe lung mass in addition to innumerable brain metastasis and questionable adrenal and bone metastasis diagnosed in January 2016.  PRIOR THERAPY: Whole brain irradiation under the care of Dr. Tammi Klippel. Completed 11/24/2014  CURRENT THERAPY: Systemic chemotherapy with carboplatin for AUC 5 and Alimta at 500 mg/m given every 3 weeks. Status post 3 cycles  Subjective: The patient is seen and examined today. His mother and 2 other family members are at the bedside. The patient is still very confused and disoriented to surrounding with agitation at time. He also has a Actuary at the bedside. He was admitted yesterday with confusion likely secondary to leptomeningeal disease.    Objective: Vital signs in last 24 hours: Temp:  [98.4 F (36.9 C)-98.5 F (36.9 C)] 98.5 F (36.9 C) (04/19 0650) Pulse Rate:  [76-93] 93 (04/19 0650) Resp:  [16-20] 20 (04/19 0650) BP: (145-153)/(80-93) 153/90 mmHg (04/19 0650) SpO2:  [94 %-100 %] 98 % (04/19 0650) Weight:  [119 lb (53.978 kg)] 119 lb (53.978 kg) (04/18 1652)  Intake/Output from previous day: 04/18 0701 - 04/19 0700 In: -  Out: 250 [Urine:250] Intake/Output this shift: Total I/O In: 1108.3 [I.V.:1108.3] Out: -   General appearance: slowed mentation and uncooperative Resp: clear to auscultation bilaterally Cardio: regular rate and rhythm, S1, S2 normal, no murmur, click, rub or gallop GI: soft, non-tender; bowel sounds normal; no masses,  no organomegaly Extremities: extremities normal, atraumatic, no cyanosis or edema  Lab Results:   Recent Labs  02/01/15 1932  WBC 7.2  HGB 13.5  HCT 37.9*  PLT 122*   BMET  Recent Labs  02/01/15 1932 02/02/15 0443  NA 134* 133*  K 3.5 3.2*  CL 94* 99  CO2 27 26  GLUCOSE 93 117*  BUN 24* 19  CREATININE 0.62 0.56   CALCIUM 8.8 8.2*    Studies/Results: No results found.  Medications: I have reviewed the patient's current medications.   Assessment/Plan: This is a very pleasant 54 years old white male with metastatic non-small cell lung cancer with multiple brain metastasis status post whole brain irradiation followed by 3 cycles of systemic chemotherapy was carboplatin and Alimta. Unfortunately the patient has significant deterioration in his condition with confusion secondary to leptomeningeal disease seen on recent MRI of the brain. He also has poor by mouth intake and dehydration. He was treated with IV hydration as well as Decadron over the last few days on outpatient basis but failed to show any improvement. I had a lengthy discussion with the family today about his current condition and prognosis. Unfortunately was a new finding of leptomeningeal disease, the patient has very poor prognosis and survival is probably limited to maximum few weeks. I recommended for the family to consider palliative care and hospice at this point. For dehydration, continue IV hydration for now.   LOS: 1 day    Lashia Niese K. 02/02/2015

## 2015-02-02 NOTE — Progress Notes (Signed)
[facility] PROGRESS NOTE   Jason Keller DVV:[id number] DOB: 09-May-1961 DOA: 02/01/2015 PCP: No PCP Per Patient  HPI/Subjective: Seen with mother at bedside, patient is confused and did not respond to verbal stimuli. Other family members in the room as well as sitter.  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 Per oncology patient showed progressive worsening. Problems with agitation, judgment and wandering around. Discussed with oncology, palliative and hospice team will be consulted.   Dehydration/failure to thrive  -On IV fluids, dietitian consulted for failure to thrive.  Generalized weakness  -No evidence of infection, this is likely secondary to metastatic carcinoma and meningeal involvement. -Was on Decadron not for very long time, doubt there is adrenal insufficiency  Delirium/agitation  -Delirium is secondary to leptomeningeal metastasis, cannot rule out radiation cerebritis. -Discontinue EEG as probably it will not change management. -Palliative and hospice care consulted.  Code Status: Full Code Family Communication: Plan discussed with the patient. Disposition Plan: Remains inpatient Diet: Diet regular Room service appropriate?: Yes; Fluid consistency:: Thin  Consultants:  Oncology  Procedures:  None  Antibiotics:  None   Objective: Filed Vitals:   02/02/15 1100  BP:   Pulse:   Temp: 97.7 F (36.5 C)  Resp:     Intake/Output Summary (Last 24 hours) at 02/02/15 1350 Last data filed at 02/02/15 1100  Gross per 24 hour  Intake 1108.33 ml  Output    450 ml  Net 658.33 ml   Filed Weights   02/01/15 1652  Weight: 53.978 kg (119 lb)    Exam: General: Alert and awake, oriented x3, not in  any acute distress. HEENT: anicteric sclera, pupils reactive to light and accommodation, EOMI CVS: S1-S2 clear, no murmur rubs or gallops Chest: clear to auscultation bilaterally, no wheezing, rales or rhonchi Abdomen: soft nontender, nondistended, normal bowel sounds, no organomegaly Extremities: no cyanosis, clubbing or edema noted bilaterally Neuro: Cranial nerves II-XII intact, no focal neurological deficits  Data Reviewed: Basic Metabolic Panel:  Recent Labs Lab 01/29/15 1002 02/01/15 1932 02/02/15 0443  NA 131* 134* 133*  K 3.9 3.5 3.2*  CL 97 94* 99  CO2 '24 27 26  '$ GLUCOSE 104* 93 117*  BUN 21 24* 19  CREATININE 0.68 0.62 0.56  CALCIUM 8.8 8.8 8.2*  MG  --  2.0  --    Liver Function Tests:  Recent Labs Lab 01/29/15 1002 02/01/15 1932  AST 19 24  ALT 32 37  ALKPHOS 87 102  BILITOT 0.3 1.0  PROT 6.6 6.6  ALBUMIN 3.8 3.8   No results for input(s): LIPASE, AMYLASE in the last 168 hours. No results for input(s): AMMONIA in the last 168 hours. CBC:  Recent Labs Lab 01/29/15 0905 02/01/15 1932  WBC 6.6 7.2  NEUTROABS 6.0  --   HGB 13.2 13.5  HCT 39.4 37.9*  MCV 94.3 91.5  PLT 129* 122*   Cardiac Enzymes: No results for input(s): CKTOTAL, CKMB, CKMBINDEX, TROPONINI in the last 168 hours. BNP (last 3 results) No results for input(s): BNP in the last 8760 hours.  ProBNP (last 3 results) No results for input(s): PROBNP in the last 8760 hours.  CBG: No results for input(s): GLUCAP in the last 168 hours.  Micro No results found for this or  any previous visit (from the past 240 hour(s)).   Studies: No results found.  Scheduled Meds: . dexamethasone  4 mg Intravenous 4 times per day  . enoxaparin (LOVENOX) injection  40 mg Subcutaneous Q24H  . famotidine (PEPCID) IV  20 mg Intravenous Q24H  . pantoprazole (PROTONIX) IV  40 mg Intravenous Q24H   Continuous Infusions: . dextrose 5 % and 0.9% NaCl 100 mL/hr at 02/02/15 0411       Time spent: 35  minutes    [facility]  [facility] Pager [phone] If 7PM-7AM, please contact night-coverage at [email or web address], password [facility] 02/02/2015, 1:50 PM  LOS: 1 day

## 2015-02-03 ENCOUNTER — Inpatient Hospital Stay (HOSPITAL_COMMUNITY): Payer: PRIVATE HEALTH INSURANCE

## 2015-02-03 ENCOUNTER — Telehealth: Payer: Self-pay | Admitting: Nurse Practitioner

## 2015-02-03 ENCOUNTER — Encounter: Payer: Self-pay | Admitting: Nurse Practitioner

## 2015-02-03 ENCOUNTER — Ambulatory Visit: Payer: PRIVATE HEALTH INSURANCE | Admitting: Physician Assistant

## 2015-02-03 DIAGNOSIS — R404 Transient alteration of awareness: Secondary | ICD-10-CM | POA: Insufficient documentation

## 2015-02-03 LAB — URINE CULTURE: Colony Count: 3000

## 2015-02-03 LAB — CBC WITH DIFFERENTIAL/PLATELET
BASOS ABS: 0 10*3/uL (ref 0.0–0.1)
Basophils Relative: 0 % (ref 0–1)
Eosinophils Absolute: 0 10*3/uL (ref 0.0–0.7)
Eosinophils Relative: 0 % (ref 0–5)
HCT: 36.4 % — ABNORMAL LOW (ref 39.0–52.0)
Hemoglobin: 12.9 g/dL — ABNORMAL LOW (ref 13.0–17.0)
LYMPHS PCT: 5 % — AB (ref 12–46)
Lymphs Abs: 0.3 10*3/uL — ABNORMAL LOW (ref 0.7–4.0)
MCH: 32.7 pg (ref 26.0–34.0)
MCHC: 35.4 g/dL (ref 30.0–36.0)
MCV: 92.4 fL (ref 78.0–100.0)
Monocytes Absolute: 0.3 10*3/uL (ref 0.1–1.0)
Monocytes Relative: 4 % (ref 3–12)
NEUTROS ABS: 6 10*3/uL (ref 1.7–7.7)
Neutrophils Relative %: 91 % — ABNORMAL HIGH (ref 43–77)
PLATELETS: 103 10*3/uL — AB (ref 150–400)
RBC: 3.94 MIL/uL — AB (ref 4.22–5.81)
RDW: 15 % (ref 11.5–15.5)
WBC: 6.6 10*3/uL (ref 4.0–10.5)

## 2015-02-03 LAB — COMPREHENSIVE METABOLIC PANEL
ALBUMIN: 3.2 g/dL — AB (ref 3.5–5.2)
ALT: 43 U/L (ref 0–53)
AST: 23 U/L (ref 0–37)
Alkaline Phosphatase: 103 U/L (ref 39–117)
Anion gap: 8 (ref 5–15)
BILIRUBIN TOTAL: 0.8 mg/dL (ref 0.3–1.2)
BUN: 12 mg/dL (ref 6–23)
CHLORIDE: 105 mmol/L (ref 96–112)
CO2: 23 mmol/L (ref 19–32)
CREATININE: 0.57 mg/dL (ref 0.50–1.35)
Calcium: 8.4 mg/dL (ref 8.4–10.5)
GFR calc Af Amer: >90 mL/min (ref >90)
Glucose, Bld: 109 mg/dL — ABNORMAL HIGH (ref 70–99)
Potassium: 3.7 mmol/L (ref 3.5–5.1)
Sodium: 136 mmol/L (ref 135–145)
Total Protein: 6.1 g/dL (ref 6.0–8.3)

## 2015-02-03 LAB — LACTIC ACID, PLASMA: LACTIC ACID, VENOUS: 1.1 mmol/L (ref 0.5–2.0)

## 2015-02-03 MED ORDER — POTASSIUM CHLORIDE 10 MEQ/100ML IV SOLN
10.0000 meq | INTRAVENOUS | Status: AC
  Administered 2015-02-03 (×2): 10 meq via INTRAVENOUS
  Filled 2015-02-03 (×2): qty 100

## 2015-02-03 NOTE — Telephone Encounter (Signed)
Per pof appointments for 4/21 have been cancelled,i have spoken with karen at rad/onc and she will  Cancel their appointment

## 2015-02-03 NOTE — Progress Notes (Signed)
Chaplain rounding and was referred by unit nurse to check on Mr Ramsaran and his family. Mr Lowdermilk was surrounded by family. Family felt that they have everything in their control and declined chaplain care at this time. Mr Henkels was not responsive to chaplain voice.   Page chaplain if further care is needed or requested.  Sallee Lange. Jayleen Afonso, DMin, MDiv Chaplain

## 2015-02-03 NOTE — Assessment & Plan Note (Signed)
Patient has had minimal appetite; and continues to lose weight.  Most likely, this is a chemotherapy side effect; but could also be attributed to patient's chronic nausea.  Patient was encouraged to eat multiple small meals throughout the day if at all possible.   

## 2015-02-03 NOTE — Assessment & Plan Note (Signed)
Patient completed whole brain irradiation on 11/24/2014. Patient completed cycle 3 of his carboplatin/Alimta chemotherapy regimen on 01/07/2015.  He was initially scheduled to receive cycle 4 of the same regimen on 01/28/2015; but the decision has been made to hold chemotherapy this week due to patient's progressive weakness and dehydration.  Dr. Julien Nordmann reviewed restaging brain MRI obtained on 01/08/2015 with both patient and family earlier this week.  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  Patient was advised to increase his dexamethasone 4 mg from 3 times per day up to 4 times per day.    Patient was scheduled for restaging CT of the chest/abdomen/pelvis today; but family reports that patient has become so altered that he cannot take in any oral contrast or sit still long enough for scan.  Patient will be direct admitted to the floor for further evaluation and management today.

## 2015-02-03 NOTE — Progress Notes (Signed)
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 Per oncology patient showed progressive worsening. Problems with agitation, judgment and wandering around. Continue Decadron 4 mg IV every 6 hours Discussed with oncology, palliative and hospice team consulted.   Dehydration/failure to thrive  On IV fluids, dietitian consulted for failure to thrive.  Hypokalemia Replace potassium and check BMP in a.m.  Generalized weakness  -No evidence of infection, this is likely secondary to metastatic carcinoma and meningeal involvement. -Was on Decadron not for very long time, doubt there is adrenal insufficiency  Delirium/agitation  -Delirium is secondary to leptomeningeal metastasis, cannot rule out radiation cerebritis. -Discontinue EEG as probably it will not change management. -Palliative and hospice care consulted.   Code Status: *Full code Family Communication: *Discussed with mother at bedside Disposition Plan: To be decided   Consultants:  Palliative care  Procedures:  None  Antibiotics:  None  HPI/Subjective: 54 y.o. male diagnosed with lung cancer; with brain metastasis. Also questionable metastasis to both the adrenals and bone. Patient is status post whole brain irradiation completed on 11/24/2014. The patient has also had radiation therapy to his left flank/hip area with the last treatment on 01/06/2015. His last cycle of chemotherapy which 01/07/2015. His fourth cycle of chemotherapy was scheduled for 01/28/2015, but has been canceled due to the patient's decline clinically. Since the patient began whole brain radiation, he has been less and less conversant to the point where he only states a few words. He is able to verbalize his needs. The  patient has had anorexia with chronic nausea. There has not been any vomiting, diarrhea, fevers, chills, chest pain, shortness breath, dysuria. The patient is unable to provide any reliable history. All of this history is obtained per review of medical records and speaking with the patient's mother and girlfriend at the bedside.  Patient continues to be delirious.  Objective: Filed Vitals:   02/03/15 1415  BP: 138/81  Pulse: 90  Temp: 98.4 F (36.9 C)  Resp: 20    Intake/Output Summary (Last 24 hours) at 02/03/15 1605 Last data filed at 02/03/15 1300  Gross per 24 hour  Intake      0 ml  Output   1600 ml  Net  -1600 ml   Filed Weights   02/01/15 1652  Weight: 53.978 kg (119 lb)    Exam:   General:  Appear in no acute distress  Cardiovascular: S1S2 RRR  Respiratory: Clear bilaterally  Abdomen: Soft, nontender  Musculoskeletal: No edema of the lower extremities   Data Reviewed: Basic Metabolic Panel:  Recent Labs Lab 01/29/15 1002 02/01/15 1932 02/02/15 0443  NA 131* 134* 133*  K 3.9 3.5 3.2*  CL 97 94* 99  CO2 '24 27 26  '$ GLUCOSE 104* 93 117*  BUN 21 24* 19  CREATININE 0.68 0.62 0.56  CALCIUM 8.8 8.8 8.2*  MG  --  2.0  --    Liver Function Tests:  Recent Labs Lab 01/29/15 1002 02/01/15 1932  AST 19 24  ALT 32 37  ALKPHOS 87 102  BILITOT 0.3 1.0  PROT 6.6 6.6  ALBUMIN 3.8 3.8   No results for input(s): LIPASE, AMYLASE in the last 168 hours. No results for input(s): AMMONIA in the last 168 hours. CBC:  Recent Labs Lab 01/29/15 0905 02/01/15 1932  WBC 6.6 7.2  NEUTROABS 6.0  --   HGB 13.2 13.5  HCT 39.4 37.9*  MCV 94.3 91.5  PLT 129* 122*   Cardiac Enzymes: No results for input(s): CKTOTAL, CKMB, CKMBINDEX, TROPONINI in the last 168 hours. BNP (last 3 results) No results for input(s): BNP in the last 8760 hours.  ProBNP (last 3 results) No results for input(s): PROBNP in the last 8760 hours.  CBG: No results for input(s): GLUCAP  in the last 168 hours.  Recent Results (from the past 240 hour(s))  Urine culture     Status: None   Collection Time: 02/02/15  6:11 AM  Result Value Ref Range Status   Specimen Description URINE, RANDOM  Final   Special Requests NONE  Final   Colony Count   Final    3,000 COLONIES/ML Performed at [facility]    Culture   Final    INSIGNIFICANT GROWTH Performed at [facility]    Report Status 02/03/2015 FINAL  Final     Studies: No results found.  Scheduled Meds: . dexamethasone  4 mg Intravenous 4 times per day  . enoxaparin (LOVENOX) injection  40 mg Subcutaneous Q24H  . famotidine (PEPCID) IV  20 mg Intravenous Q24H  . pantoprazole (PROTONIX) IV  40 mg Intravenous Q24H   Continuous Infusions: . dextrose 5 % and 0.9% NaCl 100 mL/hr at 02/03/15 1144    Active Problems:   Non-small cell cancer of right lung   Anorexia   Dehydration   Weakness   Weight loss   Subacute delirium   Cancer related pain   Palliative care encounter   Acute encephalopathy    Time spent: *25 min    [place] [facility] Pager [phone]. If 7PM-7AM, please contact night-coverage at [email or web address], password [facility] 02/03/2015, 4:05 PM  LOS: 2 days

## 2015-02-03 NOTE — Care Management Note (Signed)
CARE MANAGEMENT NOTE 02/03/2015  Patient:  Jason Keller, Jason Keller   Account Number:  [id number]  Date Initiated:  02/03/2015  Documentation initiated by:  Marney Doctor  Subjective/Objective Assessment:   54 yo admitted with Dehydration     Action/Plan:   From home with significant other   Anticipated DC Date:  02/06/2015   Anticipated DC Plan:  [place]  CM consult      Choice offered to / List presented to:             Status of service:  In process, will continue to follow Medicare Important Message given?   (If response is "NO", the following Medicare IM given date fields will be blank) Date Medicare IM given:   Medicare IM given by:   Date Additional Medicare IM given:   Additional Medicare IM given by:    Discharge Disposition:    Per UR Regulation:  Reviewed for med. necessity/level of care/duration of stay  If discussed at [place] of Stay Meetings, dates discussed:    Comments:  02/03/15 Marney Doctor RN,BSN,NCM [phone] Chart reviewed and CM following for DC needs. Unsure of disposition plan at this time. Palliative Medicine following pt.

## 2015-02-03 NOTE — Assessment & Plan Note (Signed)
Patient continues with very poor oral intake; and continues to feel dehydrated.  He did receive IV fluid rehydration every day last week.

## 2015-02-03 NOTE — Progress Notes (Signed)
Patient Jason Keller      DOB: May 26, 1961      ION:[id number]   Palliative Medicine Team at [facility] Progress Note    Subjective: Minimally responsive. Unable to provide history. Family reports that he has good response to haldol.    Filed Vitals:   02/03/15 1415  BP: 138/81  Pulse: 90  Temp: 98.4 F (36.9 C)  Resp: 20   Physical exam: GEN: lethargic, NAD Skin: warm  CBC    Component Value Date/Time   WBC 7.2 02/01/2015 1932   WBC 6.6 01/29/2015 0905   RBC 4.14* 02/01/2015 1932   RBC 4.18* 01/29/2015 0905   HGB 13.5 02/01/2015 1932   HGB 13.2 01/29/2015 0905   HCT 37.9* 02/01/2015 1932   HCT 39.4 01/29/2015 0905   PLT 122* 02/01/2015 1932   PLT 129* 01/29/2015 0905   MCV 91.5 02/01/2015 1932   MCV 94.3 01/29/2015 0905   MCH 32.6 02/01/2015 1932   MCH 31.7 01/29/2015 0905   MCHC 35.6 02/01/2015 1932   MCHC 33.6 01/29/2015 0905   RDW 14.9 02/01/2015 1932   RDW 15.7* 01/29/2015 0905   LYMPHSABS 0.1* 01/29/2015 0905   LYMPHSABS 2.0 10/08/2014 2004   MONOABS 0.3 01/29/2015 0905   MONOABS 0.5 10/08/2014 2004   EOSABS 0.0 01/29/2015 0905   EOSABS 0.2 10/08/2014 2004   BASOSABS 0.1 01/29/2015 0905   BASOSABS 0.0 10/08/2014 2004    CMP     Component Value Date/Time   NA 133* 02/02/2015 0443   NA 134* 01/26/2015 1309   K 3.2* 02/02/2015 0443   K 3.8 01/26/2015 1309   CL 99 02/02/2015 0443   CO2 26 02/02/2015 0443   CO2 23 01/26/2015 1309   GLUCOSE 117* 02/02/2015 0443   GLUCOSE 95 01/26/2015 1309   BUN 19 02/02/2015 0443   BUN 20.5 01/26/2015 1309   CREATININE 0.56 02/02/2015 0443   CREATININE 0.7 01/26/2015 1309   CALCIUM 8.2* 02/02/2015 0443   CALCIUM 8.6 01/26/2015 1309   PROT 6.6 02/01/2015 1932   PROT 6.5 01/26/2015 1309   ALBUMIN 3.8 02/01/2015 1932   ALBUMIN 3.5 01/26/2015 1309   AST 24 02/01/2015 1932   AST 19 01/26/2015 1309   ALT 37 02/01/2015 1932   ALT 36 01/26/2015 1309   ALKPHOS 102 02/01/2015 1932   ALKPHOS 101  01/26/2015 1309   BILITOT 1.0 02/01/2015 1932   BILITOT 0.70 01/26/2015 1309   GFRNONAA >90 02/02/2015 0443   GFRAA >90 02/02/2015 0443      Assessment and plan: 54 yo male with 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 Admitted with FTT, decreased PO intake, confusion  1. Code Status: Full documented  2. GOC: See initial consult. [name]) still really struggling.  She was told that Jason Keller can stay a few days in hospital and she plans on seeing that out. I doubt that anything we do here will lead to any improvement in his condition.  I suspect that Jason Keller knows this and family seems to know.  They at least tell me they have started talking about where to go from here.  They have heard about poor prognosis and hospice care from Dr Earlie Server.  Jason Keller reports bad experience with inpatient hospice facility and has general mistrust in hospice care.  I discussed with her that ultimately decisions of where to go from here need to be decided.   I have discussed with other family issues related to his current code status, and  they report they will work on this with her as well.   3. Symptom Management:  1. Cancer Related Pain: On low dose morphine IV, controlled 2.   Acute Encephalopathy: family reports better response to haldol and I would consider only using this and d/c'ing ativan.    4. Psychosocial/Spiritual: Divorced, lives in [place] with his girlfriend. Has 1 son.     Total Time: 15 minutes >50% of time spent in counseling and coordination of care regarding above  Doran Clay D.O. Palliative Medicine Team at [facility]  Pager: [phone] Team Phone: [phone]

## 2015-02-03 NOTE — Progress Notes (Signed)
SYMPTOM MANAGEMENT CLINIC   HPI: Jason Keller 54 y.o. male diagnosed with lung cancer; with brain metastasis.  Also questionable metastasis to both the adrenals and bone.  Patient is status post whole brain irradiation completed on 11/24/2014..  Currently undergoing carboplatin/Alimta chemotherapy regimen.   MRI of the brain obtained on 01/08/2015 revealed probable leptomeningeal metastasis.  Patient was scheduled to obtain a restaging CT of the chest/abdomen/pelvis today; but has been unable to drink the oral contrast; and is unable to sit still long enough for a scan today.  Patient does appear very confused on exam today; and only able to occasionally answer very simple questions.  He is only able to follow only simple commands occasionally.  He was observed pacing about the room, playing with electrical cord and outlet, and walking the halls at the cancer center.  He is also urinating and stooling himself.  Most likely, these new symptoms are secondary to patient's leptomeningeal metastasis.  Patient will be direct admitted to the floor for further evaluation and management per the hospitalist Dr.Elgergawy.  Brief history and report was called to the hospitalist prior to patient being transported to the floor via wheelchair per the [facility].   HPI  ROS  Past Medical History  Diagnosis Date  . Vertigo   . 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 Video bronchoscopy Bilateral 10/26/2014    Procedure: VIDEO BRONCHOSCOPY WITHOUT FLUORO;  Surgeon: Tanda Rockers, MD;  Location: [facility];  Service: Cardiopulmonary;  Laterality: Bilateral;    has Lung mass; Brain metastases; Non-small cell cancer of right lung; Neoplasm related pain; Nausea without vomiting; Anorexia; Dehydration;  Weakness; Weight loss; Thrush of mouth and esophagus; Subacute delirium; Cancer related pain; Palliative care encounter; Acute encephalopathy; and Altered level of consciousness on his problem list.    is allergic to oxycodone and penicillins.    Medication List       This list is accurate as of: 02/01/15  4:43 PM.  Always use your most recent med list.               dexamethasone 4 MG tablet  Commonly known as:  DECADRON  Take 1 tablet (4 mg total) by mouth 2 (two) times daily.     famotidine 20 MG tablet  Commonly known as:  PEPCID  One at bedtime     fluconazole 100 MG tablet  Commonly known as:  DIFLUCAN  Take 1 tablet (100 mg total) by mouth daily. Take 2 tablets (200 mg) on day 1, then take one tablet (162m) daily until complete     folic acid 1 MG tablet  Commonly known as:  FOLVITE  Take 1 tablet (1 mg total) by mouth daily.     HYDROcodone-acetaminophen 5-325 MG per tablet  Commonly known as:  NORCO/VICODIN  Take 1-2 tablets by mouth every 6 (six) hours as needed for moderate pain.     magic mouthwash Soln  Take 5 mLs by mouth 4 (four) times daily as needed for mouth pain ([employer] mouthwash w/ nystatin.).     morphine 15 MG tablet  Commonly known as:  MSIR  Take 1 tablet (15 mg total) by mouth every 4 (four) hours as needed for severe pain.     ondansetron 8  MG disintegrating tablet  Commonly known as:  ZOFRAN ODT  Take 1 tablet (8 mg total) by mouth every 8 (eight) hours as needed for nausea or vomiting.     pantoprazole 40 MG tablet  Commonly known as:  PROTONIX  Take 1 tablet (40 mg total) by mouth daily. Take 30-60 min before first meal of the day     polyethylene glycol packet  Commonly known as:  MIRALAX / GLYCOLAX  Take 17 g by mouth 3 (three) times daily.     prochlorperazine 10 MG tablet  Commonly known as:  COMPAZINE  Take 1 tablet (10 mg total) by mouth every 6 (six) hours as needed for nausea or vomiting.     senna 8.6 MG tablet  Commonly  known as:  SENOKOT  Take 2 tablets by mouth 2 (two) times daily.         PHYSICAL EXAMINATION  Oncology Vitals 02/03/2015 02/02/2015 02/02/2015 02/02/2015 02/01/2015 02/01/2015 01/29/2015  Height - - - - - 175 cm -  Weight - - - - - 53.978 kg -  Weight (lbs) - - - - - 119 lbs -  BMI (kg/m2) - - - - - 17.57 kg/m2 -  Temp 99.5 97.5 97.7 98.5 98.5 98.4 -  Pulse 89 73 - 93 76 80 66  Resp 22 18 - _0 SpO2 100 98 - 98 94 100 99  BSA (m2) - - - - - 1.62 m2 -   BP Readings from Last 3 Encounters:  02/03/15 141/98  01/29/15 164/94  01/28/15 155/92    Physical Exam  Constitutional: Vital signs are normal. He appears malnourished and dehydrated. He appears unhealthy. He appears cachectic.  Patient appears fatigued, weak, frail, and chronically ill.  HENT:  Head: Normocephalic and atraumatic.  Mouth/Throat: Oropharynx is clear and moist.  Eyes: Conjunctivae and EOM are normal. Pupils are equal, round, and reactive to light. Right eye exhibits no discharge. Left eye exhibits no discharge. No scleral icterus.  Neck: Normal range of motion. Neck supple. No JVD present. No tracheal deviation present. No thyromegaly present.  Cardiovascular: Normal heart sounds and intact distal pulses.   Tachycardic rate.  Pulmonary/Chest: Effort normal and breath sounds normal. No respiratory distress. He has no wheezes. He has no rales. He exhibits no tenderness.  Abdominal: Soft. Bowel sounds are normal. He exhibits no distension and no mass. There is no tenderness. There is no rebound and no guarding.  Musculoskeletal: Normal range of motion. He exhibits no edema or tenderness.  Lymphadenopathy:    He has no cervical adenopathy.  Neurological:  Patient was scheduled to obtain a restaging CT of the chest/abdomen/pelvis today; but has been unable to drink the oral contrast; and is unable to sit still long enough for a scan today.  Patient does appear very confused on exam today; and only able to  occasionally answer very simple questions.  He is only able to follow only simple commands occasionally.  He was observed pacing about the room, playing with electrical cord and outlet, and walking the halls at the cancer center.  He is also urinating and stooling himself.    Skin: Skin is warm and dry. No rash noted. No erythema. There is pallor.  Psychiatric:  Flat affect.  Nursing note and vitals reviewed.   LABORATORY DATA:. Admission on 02/01/2015  Component Date Value Ref Range Status  . Sodium 02/02/2015 133* 135 - 145 mmol/L Final  . Potassium 02/02/2015 3.2* 3.5 -  5.1 mmol/L Final  . Chloride 02/02/2015 99  96 - 112 mmol/L Final  . CO2 02/02/2015 26  19 - 32 mmol/L Final  . Glucose, Bld 02/02/2015 117* 70 - 99 mg/dL Final  . BUN 02/02/2015 19  6 - 23 mg/dL Final  . Creatinine, Ser 02/02/2015 0.56  0.50 - 1.35 mg/dL Final  . Calcium 02/02/2015 8.2* 8.4 - 10.5 mg/dL Final  . GFR calc non Af Amer 02/02/2015 >90  >90 mL/min Final  . GFR calc Af Amer 02/02/2015 >90  >90 mL/min Final   Comment: (NOTE) The eGFR has been calculated using the CKD EPI equation. This calculation has not been validated in all clinical situations. eGFR's persistently <90 mL/min signify possible Chronic Kidney Disease.   . Anion gap 02/02/2015 8  5 - 15 Final  . Sodium 02/01/2015 134* 135 - 145 mmol/L Final  . Potassium 02/01/2015 3.5  3.5 - 5.1 mmol/L Final  . Chloride 02/01/2015 94* 96 - 112 mmol/L Final  . CO2 02/01/2015 27  19 - 32 mmol/L Final  . Glucose, Bld 02/01/2015 93  70 - 99 mg/dL Final  . BUN 02/01/2015 24* 6 - 23 mg/dL Final  . Creatinine, Ser 02/01/2015 0.62  0.50 - 1.35 mg/dL Final  . Calcium 02/01/2015 8.8  8.4 - 10.5 mg/dL Final  . Total Protein 02/01/2015 6.6  6.0 - 8.3 g/dL Final  . Albumin 02/01/2015 3.8  3.5 - 5.2 g/dL Final  . AST 02/01/2015 24  0 - 37 U/L Final  . ALT 02/01/2015 37  0 - 53 U/L Final  . Alkaline Phosphatase 02/01/2015 102  39 - 117 U/L Final  . Total  Bilirubin 02/01/2015 1.0  0.3 - 1.2 mg/dL Final  . GFR calc non Af Amer 02/01/2015 >90  >90 mL/min Final  . GFR calc Af Amer 02/01/2015 >90  >90 mL/min Final   Comment: (NOTE) The eGFR has been calculated using the CKD EPI equation. This calculation has not been validated in all clinical situations. eGFR's persistently <90 mL/min signify possible Chronic Kidney Disease.   . Anion gap 02/01/2015 13  5 - 15 Final  . WBC 02/01/2015 7.2  4.0 - 10.5 K/uL Final  . RBC 02/01/2015 4.14* 4.22 - 5.81 MIL/uL Final  . Hemoglobin 02/01/2015 13.5  13.0 - 17.0 g/dL Final  . HCT 02/01/2015 37.9* 39.0 - 52.0 % Final  . MCV 02/01/2015 91.5  78.0 - 100.0 fL Final  . MCH 02/01/2015 32.6  26.0 - 34.0 pg Final  . MCHC 02/01/2015 35.6  30.0 - 36.0 g/dL Final  . RDW 02/01/2015 14.9  11.5 - 15.5 % Final  . Platelets 02/01/2015 122* 150 - 400 K/uL Final  . TSH 02/01/2015 2.002  0.350 - 4.500 uIU/mL Final  . Vitamin B-12 02/01/2015 1308* 211 - 911 pg/mL Final   Performed at [facility]  . Color, Urine 02/02/2015 AMBER* YELLOW Final   BIOCHEMICALS MAY BE AFFECTED BY COLOR  . APPearance 02/02/2015 CLEAR  CLEAR Final  . Specific Gravity, Urine 02/02/2015 1.023  1.005 - 1.030 Final  . pH 02/02/2015 6.5  5.0 - 8.0 Final  . Glucose, UA 02/02/2015 NEGATIVE  NEGATIVE mg/dL Final  . Hgb urine dipstick 02/02/2015 NEGATIVE  NEGATIVE Final  . Bilirubin Urine 02/02/2015 SMALL* NEGATIVE Final  . Ketones, ur 02/02/2015 NEGATIVE  NEGATIVE mg/dL Final  . Protein, ur 02/02/2015 NEGATIVE  NEGATIVE mg/dL Final  . Urobilinogen, UA 02/02/2015 1.0  0.0 - 1.0 mg/dL Final  . Nitrite  02/02/2015 NEGATIVE  NEGATIVE Final  . Leukocytes, UA 02/02/2015 NEGATIVE  NEGATIVE Final   MICROSCOPIC NOT DONE ON URINES WITH NEGATIVE PROTEIN, BLOOD, LEUKOCYTES, NITRITE, OR GLUCOSE <1000 mg/dL.  . Magnesium 02/01/2015 2.0  1.5 - 2.5 mg/dL Final  . Cortisol - AM 02/02/2015 5.6  4.3 - 22.4 ug/dL Final   Performed at [facility]: No results found.  ASSESSMENT/PLAN:    Non-small cell cancer of right lung Patient completed whole brain irradiation on 11/24/2014. Patient completed cycle 3 of his carboplatin/Alimta chemotherapy regimen on 01/07/2015.  He was initially scheduled to receive cycle 4 of the same regimen on 01/28/2015; but the decision has been made to hold chemotherapy this week due to patient's progressive weakness and dehydration.  Dr. Julien Nordmann reviewed restaging brain MRI obtained on 01/08/2015 with both patient and family earlier this week.  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  Patient was advised to increase his dexamethasone 4 mg from 3 times per day up to 4 times per day.    Patient was scheduled for restaging CT of the chest/abdomen/pelvis today; but family reports that patient has become so altered that he cannot take in any oral contrast or sit still long enough for scan.  Patient will be direct admitted to the floor for further evaluation and management today.                Anorexia Patient has had minimal appetite; and continues to lose weight.  Most likely, this is a chemotherapy side effect; but could also be attributed to patient's chronic nausea.  Patient was encouraged to eat multiple small meals throughout the day if at all possible.       Dehydration Patient continues with very poor oral intake; and continues to feel dehydrated.  He did receive IV fluid rehydration every day last week.         Altered level of consciousness MRI of the brain obtained on 01/08/2015 revealed probable leptomeningeal metastasis.  Patient was scheduled to obtain a restaging CT of the chest/abdomen/pelvis today; but has been unable to drink the oral contrast; and is unable to sit still long enough for a scan today.  Patient does appear very confused on exam today; and only able to occasionally answer very simple questions.  He is only able  to follow only simple commands occasionally.  He was observed pacing about the room, playing with electrical cord and outlet, and walking the halls at the cancer center.  He is also urinating and stooling himself.  Most likely, these new symptoms are secondary to patient's leptomeningeal metastasis.  Patient will be direct admitted to the floor for further evaluation and management per the hospitalist Dr.Elgergawy.  Brief history and report was called to the hospitalist prior to patient being transported to the floor via wheelchair per the [place].    Patient stated understanding of all instructions; and was in agreement with this plan of care. The patient knows to call the clinic with any problems, questions or concerns.    All details of visit and plan were reviewed with Dr. Julien Nordmann today.  Total time spent with patient was 40 minutes;  with greater than 75 percent of that time spent in face to face counseling regarding patient's symptoms,  and coordination of care and follow up.  Disclaimer: This note was dictated with voice recognition software. Similar sounding words can inadvertently be transcribed and may not be corrected upon review.  Drue Second, NP 02/03/2015

## 2015-02-03 NOTE — Assessment & Plan Note (Signed)
MRI of the brain obtained on 01/08/2015 revealed probable leptomeningeal metastasis.  Patient was scheduled to obtain a restaging CT of the chest/abdomen/pelvis today; but has been unable to drink the oral contrast; and is unable to sit still long enough for a scan today.  Patient does appear very confused on exam today; and only able to occasionally answer very simple questions.  He is only able to follow only simple commands occasionally.  He was observed pacing about the room, playing with electrical cord and outlet, and walking the halls at the cancer center.  He is also urinating and stooling himself.  Most likely, these new symptoms are secondary to patient's leptomeningeal metastasis.  Patient will be direct admitted to the floor for further evaluation and management per the hospitalist Dr.Elgergawy.  Brief history and report was called to the hospitalist prior to patient being transported to the floor via wheelchair per the [place].

## 2015-02-04 ENCOUNTER — Other Ambulatory Visit: Payer: PRIVATE HEALTH INSURANCE

## 2015-02-04 ENCOUNTER — Ambulatory Visit: Payer: PRIVATE HEALTH INSURANCE | Admitting: Radiation Oncology

## 2015-02-04 ENCOUNTER — Ambulatory Visit: Payer: PRIVATE HEALTH INSURANCE | Admitting: Physician Assistant

## 2015-02-04 DIAGNOSIS — E43 Unspecified severe protein-calorie malnutrition: Secondary | ICD-10-CM | POA: Insufficient documentation

## 2015-02-04 MED ORDER — MORPHINE SULFATE 2 MG/ML IJ SOLN
2.0000 mg | INTRAMUSCULAR | Status: DC | PRN
  Administered 2015-02-04 – 2015-02-08 (×20): 2 mg via INTRAVENOUS
  Filled 2015-02-04 (×20): qty 1

## 2015-02-04 MED ORDER — ENSURE ENLIVE PO LIQD: 237.0000 mL | Freq: Two times a day (BID) | ORAL | Status: DC

## 2015-02-04 MED ORDER — MORPHINE SULFATE 2 MG/ML IJ SOLN
INTRAMUSCULAR | Status: AC
  Administered 2015-02-04: 1 mg via INTRAVENOUS
  Filled 2015-02-04: qty 1

## 2015-02-04 MED ORDER — MORPHINE SULFATE 2 MG/ML IJ SOLN
1.0000 mg | Freq: Once | INTRAMUSCULAR | Status: AC
  Administered 2015-02-04: 1 mg via INTRAVENOUS

## 2015-02-04 NOTE — Progress Notes (Signed)
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 Per oncology patient showed progressive worsening. Problems with agitation, judgment and wandering around. Continue Decadron 4 mg IV every 6 hours Discussed with oncology, palliative care consulted.   Dehydration/failure to thrive  On IV fluids, dietitian consulted for failure to thrive.  Hypokalemia Replaced, potassium 3.7 today  Fever Patient temp of 101.3 last night. Blood cultures were obtained  Generalized weakness  -No evidence of infection, this is likely secondary to metastatic carcinoma and meningeal involvement. -Was on Decadron not for very long time, doubt there is adrenal insufficiency  Delirium/agitation  -Delirium is secondary to leptomeningeal metastasis, cannot rule out radiation cerebritis. -Discontinue EEG as probably it will not change management. -Palliative and hospice care consulted.   Code Status: *Full code Family Communication: *Discussed with patient's aunt at bedside. Disposition Plan: To be decided   Consultants:  Palliative care  Procedures:  None  Antibiotics:  None  HPI/Subjective: 54 y.o. male diagnosed with lung cancer; with brain metastasis. Also questionable metastasis to both the adrenals and bone. Patient is status post whole brain irradiation completed on 11/24/2014. The patient has also had radiation therapy to his left flank/hip area with the last treatment on 01/06/2015. His last cycle of chemotherapy which 01/07/2015. His fourth cycle of chemotherapy was scheduled for 01/28/2015, but has been canceled due to the patient's decline clinically. Since the patient began whole brain radiation, he has been less and less conversant to the point where he only  states a few words. He is able to verbalize his needs. The patient has had anorexia with chronic nausea. There has not been any vomiting, diarrhea, fevers, chills, chest pain, shortness breath, dysuria. The patient is unable to provide any reliable history. All of this history is obtained per review of medical records and speaking with the patient's mother and girlfriend at the bedside.  Patient continues to be delirious. Developed fever last night  Objective: Filed Vitals:   02/04/15 0710  BP: 143/92  Pulse: 85  Temp: 98 F (36.7 C)  Resp: 20    Intake/Output Summary (Last 24 hours) at 02/04/15 1429 Last data filed at 02/04/15 0630  Gross per 24 hour  Intake   1275 ml  Output    900 ml  Net    375 ml   Filed Weights   02/01/15 1652  Weight: 53.978 kg (119 lb)    Exam:   General:  Appear in no acute distress  Cardiovascular: S1S2 RRR  Respiratory: Clear bilaterally  Abdomen: Soft, nontender  Musculoskeletal: No edema of the lower extremities   Data Reviewed: Basic Metabolic Panel:  Recent Labs Lab 01/29/15 1002 02/01/15 1932 02/02/15 0443 02/03/15 2148  NA 131* 134* 133* 136  K 3.9 3.5 3.2* 3.7  CL 97 94* 99 105  CO2 '24 27 26 23  '$ GLUCOSE 104* 93 117* 109*  BUN 21 24* 19 12  CREATININE 0.68 0.62 0.56 0.57  CALCIUM 8.8 8.8 8.2* 8.4  MG  --  2.0  --   --    Liver Function Tests:  Recent Labs Lab 01/29/15 1002 02/01/15 1932 02/03/15 2148  AST '19 24 23  '$ ALT 32 37 43  ALKPHOS 87 102 103  BILITOT 0.3 1.0 0.8  PROT 6.6 6.6 6.1  ALBUMIN 3.8 3.8 3.2*   No results  for input(s): LIPASE, AMYLASE in the last 168 hours. No results for input(s): AMMONIA in the last 168 hours. CBC:  Recent Labs Lab 01/29/15 0905 02/01/15 1932 02/03/15 2148  WBC 6.6 7.2 6.6  NEUTROABS 6.0  --  6.0  HGB 13.2 13.5 12.9*  HCT 39.4 37.9* 36.4*  MCV 94.3 91.5 92.4  PLT 129* 122* 103*   Cardiac Enzymes: No results for input(s): CKTOTAL, CKMB, CKMBINDEX, TROPONINI in  the last 168 hours. BNP (last 3 results) No results for input(s): BNP in the last 8760 hours.  ProBNP (last 3 results) No results for input(s): PROBNP in the last 8760 hours.  CBG: No results for input(s): GLUCAP in the last 168 hours.  Recent Results (from the past 240 hour(s))  Urine culture     Status: None   Collection Time: 02/02/15  6:11 AM  Result Value Ref Range Status   Specimen Description URINE, RANDOM  Final   Special Requests NONE  Final   Colony Count   Final    3,000 COLONIES/ML Performed at [facility]    Culture   Final    INSIGNIFICANT GROWTH Performed at [facility]    Report Status 02/03/2015 FINAL  Final     Studies: Dg Chest Port 1 View  02/03/2015   CLINICAL DATA:  Fever.  Metastatic adenocarcinoma of the lung.  EXAM: PORTABLE CHEST - 1 VIEW  COMPARISON:  11/09/2014 PET-CT  FINDINGS: Right perihilar mass 6 mm nodule right upper lobe. Aortic atherosclerosis. Heart size upper normal. Interstitial coarsening. No pleural effusion or pneumothorax. Osteopenia. Mild curvature of the spine.  IMPRESSION: Right perihilar mass. Sub cm right upper lobe nodule may relate to be postobstructive pneumonitis or infection described on PET-CT.  Interstitial prominence at least in part reflects chronic change. Superimposed atypical/viral infection not excluded.   Electronically Signed   By: Carlos Levering M.D.   On: 02/03/2015 22:19    Scheduled Meds: . dexamethasone  4 mg Intravenous 4 times per day  . enoxaparin (LOVENOX) injection  40 mg Subcutaneous Q24H  . famotidine (PEPCID) IV  20 mg Intravenous Q24H  . feeding supplement (ENSURE ENLIVE)  237 mL Oral BID BM  . pantoprazole (PROTONIX) IV  40 mg Intravenous Q24H   Continuous Infusions: . dextrose 5 % and 0.9% NaCl 100 mL/hr at 02/04/15 2446    Active Problems:   Non-small cell cancer of right lung   Anorexia   Dehydration   Weakness   Weight loss   Subacute delirium   Cancer related pain    Palliative care encounter   Acute encephalopathy   Protein-calorie malnutrition, severe    Time spent: *25 min    [facility] Pager [phone]. If 7PM-7AM, please contact night-coverage at [email or web address], password [facility] 02/04/2015, 2:29 PM  LOS: 3 days            T

## 2015-02-04 NOTE — Progress Notes (Addendum)
  Radiation Oncology         [phone] ________________________________  Name: Jason Keller MRN: [id number]  Date: 02/01/2015  DOB: [birth year]  Chart Note:  I met with the patient's family this evening. I also briefly examined the patient who was unresponsive. I talked to the patient's family about the recent MRI on March 25 suggesting leptomeningeal carcinomatosis followed by his rapid decline with neurologic symptoms. 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 The patient and his family talked about this development with Dr. Julien Nordmann, and he educated them that there were no treatment options available in this setting. In addition, I have educated them that there are no further radiation treatment options following whole brain radiotherapy. In this setting, comfort care, and in particular hospice care is likely to be most appropriate. The patient's family is having difficulty accepting Jason Keller's condition, which is certainly understandable. In their words, they are grasping at straws. This evening, I tried to reassure them that we have done everything we possibly can and also advised hospice care. They have asked for a few more days to consider whether to involve hospice. In the meantime, they have petitioned me to check with neurosurgery on whether they agree with hospice, and also check with whether there are protocol therapies available at other institutions for him. I will pursue those questions and get back with the family.  ________________________________  Sheral Apley Tammi Klippel, M.D.                  Addendum: I consulted ClinicalTrials.gov searching non-small cell lung cancer and leptomeningeal.  No trials were open for Jason Keller.  The only trials would be for EGFR positive patients, and he is EGFR negative:

## 2015-02-04 NOTE — Progress Notes (Signed)
I have met with Mr. Rudden along with his mother (and her sister), son, and niece at bedside. He appears to be sleeping and does not participate in discussion. His mother tells me that she is planning to meet with Dr. Tammi Klippel tonight and she likes and feels she can trust his opinion. She tells me "I just need to be sure that we have tried everything." I think it will be very helpful to have her meet with Dr. Tammi Klippel. I will follow up with them tomorrow.   Vinie Sill, NP Palliative Medicine Team Pager # [phone] (M-F 8a-5p) Team Phone # [phone] (Nights/Weekends)

## 2015-02-04 NOTE — Progress Notes (Signed)
Pt c/o pain in his head, unable to state a number.  Too soon for more morphine, last dose give at 14:10.  MD called, one time order for '1mg'$  morphine received.

## 2015-02-04 NOTE — Progress Notes (Signed)
INITIAL NUTRITION ASSESSMENT  DOCUMENTATION CODES Per approved criteria  -Severe malnutrition in the context of chronic illness -Underweight  Pt meets criteria for severe MALNUTRITION in the context of chronic illness as evidenced by 26% weight loss x 3 months and energy intake <75% for >/= 1 month.  INTERVENTION: Provide Ensure Enlive po BID, each supplement provides 350 kcal and 20 grams of protein RD to continue to monitor for care plan  NUTRITION DIAGNOSIS: Increased nutrient needs related to advanced cancer and associated treatments as evidenced by estimated nutritional needs.   Goal: Pt to meet >/= 90% of their estimated nutrition needs   Monitor:  GOC, PO intake, weight, labs, I/O's  Reason for Assessment: Consult for nutritional assessment  Admitting Dx: FTT, dehydration  ASSESSMENT: 54 y.o. male diagnosed with lung cancer; with brain metastasis. Also questionable metastasis to both the adrenals and bone. Patient is status post whole brain irradiation completed on 11/24/2014. The patient has had anorexia with chronic nausea.  Pt being followed by palliative care. MD recommending hospice/comfort care. Family has not come to a decision yet as far as his [place]. RD will monitor.  Pt unable to provide history. Pt has been confused, agitated and non-responsive. Per RN at interdisciplinary rounds, pt's family has been feeding the pt laying down. RN discouraged this. Pt has had poor PO intake: 0%.   Pt has lost 42 lb since January (26% weight loss x 3 months, significant for time frame).  Labs reviewed: WNL  Height: Ht Readings from Last 1 Encounters:  02/01/15 '5\' 9"'$  (1.753 m)    Weight: Wt Readings from Last 1 Encounters:  02/01/15 119 lb (53.978 kg)    Ideal Body Weight: 160 lb  % Ideal Body Weight: 74%  Wt Readings from Last 10 Encounters:  02/01/15 119 lb (53.978 kg)  01/26/15 131 lb 4.8 oz (59.557 kg)  01/08/15 148 lb (67.132 kg)  01/07/15 142 lb 8 oz  (64.638 kg)  12/25/14 150 lb 1.6 oz (68.085 kg)  12/17/14 155 lb 11.2 oz (70.625 kg)  12/03/14 158 lb (71.668 kg)  11/11/14 158 lb 9.6 oz (71.94 kg)  11/05/14 156 lb 11.2 oz (71.079 kg)  11/03/14 161 lb (73.029 kg)    Usual Body Weight: 145-150 lb per weight history  % Usual Body Weight: 82%  BMI:  Body mass index is 17.57 kg/(m^2).  Estimated Nutritional Needs: Kcal: 1500-1700 Protein: 80-90g Fluid: 1.5L/day  Skin: Stage 1 pressure ulcer  Diet Order: Diet regular Room service appropriate?: Yes; Fluid consistency:: Thin  EDUCATION NEEDS: -No education needs identified at this time   Intake/Output Summary (Last 24 hours) at 02/04/15 1001 Last data filed at 02/04/15 0630  Gross per 24 hour  Intake   1275 ml  Output   1200 ml  Net     75 ml    Last BM: 4/20  Labs:   Recent Labs Lab 02/01/15 1932 02/02/15 0443 02/03/15 2148  NA 134* 133* 136  K 3.5 3.2* 3.7  CL 94* 99 105  CO2 '27 26 23  '$ BUN 24* 19 12  CREATININE 0.62 0.56 0.57  CALCIUM 8.8 8.2* 8.4  MG 2.0  --   --   GLUCOSE 93 117* 109*    CBG (last 3)  No results for input(s): GLUCAP in the last 72 hours.  Scheduled Meds: . dexamethasone  4 mg Intravenous 4 times per day  . enoxaparin (LOVENOX) injection  40 mg Subcutaneous Q24H  . famotidine (PEPCID) IV  20 mg  Intravenous Q24H  . pantoprazole (PROTONIX) IV  40 mg Intravenous Q24H    Continuous Infusions: . dextrose 5 % and 0.9% NaCl 100 mL/hr at 02/04/15 4103    Past Medical History  Diagnosis Date  . Vertigo   . 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 Video bronchoscopy Bilateral 10/26/2014    Procedure: VIDEO BRONCHOSCOPY WITHOUT FLUORO;  Surgeon: Tanda Rockers, MD;  Location: [facility];  Service: Cardiopulmonary;  Laterality: Bilateral;     Clayton Bibles, MS, RD, LDN Pager: [phone] After Hours Pager: [phone]

## 2015-02-05 ENCOUNTER — Telehealth: Payer: Self-pay | Admitting: Radiation Oncology

## 2015-02-05 DIAGNOSIS — C7949 Secondary malignant neoplasm of other parts of nervous system: Secondary | ICD-10-CM

## 2015-02-05 NOTE — Progress Notes (Signed)
Patient ID: Jason Keller, male   DOB: 14-Aug-1961, 54 y.o.   MRN: [id number] Patient seen and briefly examined.  Cachectic appearing  Male in a comatose state. Weak eye opening to pain. Pt had received some pain meds recently. Pupils 40m and sluggish. Moderate meningismus.   I spoke with patients mother and discussed his situation. I have nothing to offer from a neurosurgical standpoint but consolation.  Agree with comfort care measures and hospice.

## 2015-02-05 NOTE — Clinical Social Work Note (Signed)
Clinical Social Work Assessment  Patient Details  Name: Jason Keller MRN: [id number] Date of Birth: 06/29/1961  Date of referral:  02/05/15               Reason for consult:  Facility Placement                Permission sought to share information with:  Other (Pt only alert to self, mother is next of kin ) Permission granted to share information::     Name::     Sadiel Mota   Agency::  [facility]   Relationship::  mother  Contact Information:  [phone]  Housing/Transportation Living arrangements for the past 2 months:  [place] of Information:  Parent Patient Interpreter Needed:  None Criminal Activity/Legal Involvement Pertinent to Current Situation/Hospitalization:  No - Comment as needed Significant Relationships:  Other Family Members, Parents Lives with:  Self Do you feel safe going back to the place where you live?  No Need for family participation in patient care:  Yes (Comment)  Care giving concerns:  Patient family is concerned about being far from patient during last days of life. Pt mother expressed that all the family is here in [place] and the importance of patient being at [facility] / plan:  CSW completed psychosocial assessment with pt mother. Pt mother shares they are interested in Residential Hopsice Placement in order to keep patient the most comfortable and in a family and patient centered environment. Pt mother expressed the importance of having patient at [facility] due to the entire family at bedside. Pt has strong family support, with a room full of family at bedside. Pt mother gave CSW permission to refer patient to [facility] only and begin the process of residential hospice. CSW and pt mother discussed the disposition plan and the importance of transfering patient while patient remains stable to residential hospice facility. CSW and pt mother discussed looking at other facilities near by, however  pt mother is not interested in any other facility. Pt father is also in agreement with this plan per pt mother. Pt parents are divorced however both involved in patient care.  Employment status:  [employer] information:  Managed Care PT Recommendations:  Not assessed at this time Information / Referral to community resources:  Residential hospice facility  Patient/Family's Response to care:  Patient family are concerned about patient only being in [place]. Pt family expressed the feeling of being pushed to choose a disposition. CSW provided suppportive counseling and active listening. Pt family thanked csw for concern and support.   Patient/Family's Understanding of and Emotional Response to Diagnosis, Current Treatment, and Prognosis:  Pt family are accepting of patient terminal diagnosis and are motivated to assist with pt careneeds. Pt family however is not accepting to alternate disposition plans at this time if needed outside of [facility].   Emotional Assessment Appearance:  Calm and resting Attitude/Demeanor/Rapport:  Unresponsive Affect (typically observed):    Orientation:  Oriented to Self, Fluctuating Orientation (Suspected and/or reported Sundowners) Alcohol / Substance use:    Psych involvement (Current and /or in the community):  No (Comment)  Discharge Needs  Concerns to be addressed:  Discharge Planning Concerns Readmission within the last 30 days:  No Current discharge risk:  Terminally ill Barriers to Discharge:  Hospice Bed not available   Jason Keller, [place], LCSW 02/05/2015, 1:22 PM

## 2015-02-05 NOTE — Telephone Encounter (Signed)
Patient's mother, Butch Penny, called and left a message. Promptly returned her call. Butch Penny having difficulty coming to terms with the aggressive nature of her son's disease.Butch Penny expressed concern that "all options to save her son hadn't been explored." Butch Penny questioned if Dr. Ellene Route could help.  Listened to her concerns and attempted to provide support. Butch Penny expressed appreciation for Dr. Tammi Klippel and hopes he can help her better understand her son's condition. Butch Penny understands to contact this RN with future needs.

## 2015-02-05 NOTE — Progress Notes (Addendum)
Daily Progress Note   Patient Name: Jason Keller       Date: 02/05/2015 DOB: Mar 21, 1961  Age: 54 y.o. MRN#: [id number] Attending Physician: Oswald Hillock, MD Primary Care Physician: No PCP Per Patient Admit Date: 02/01/2015  Reason for Consultation/Follow-up: GOC  Subjective: Mr. Carico is lying in bed sleeping with multiple people at bedside including his mother, Butch Penny, and girlfriend, Jenny Reichmann. They tell me that he had a good night last night but had terrible headaches/pain yesterday evening. We discussed that they were planning for [facility] potentially tomorrow if bed available. We also discussed code status and Butch Penny and Jenny Reichmann and Butch Penny at first wanted "anything to help him" but after further discussion she decided that would "only prolong his suffering - I don't want that." Jenny Reichmann agrees. They will confirm this with his father as well but feel certain he will agree. They agree for DNR now.   Interval Events: 4/22 DNR  Length of Stay: 4 days  Current Medications: Scheduled Meds:  . dexamethasone  4 mg Intravenous 4 times per day  . enoxaparin (LOVENOX) injection  40 mg Subcutaneous Q24H  . famotidine (PEPCID) IV  20 mg Intravenous Q24H  . feeding supplement (ENSURE ENLIVE)  237 mL Oral BID BM  . pantoprazole (PROTONIX) IV  40 mg Intravenous Q24H    Continuous Infusions: . dextrose 5 % and 0.9% NaCl 100 mL/hr at 02/05/15 0323    PRN Meds: acetaminophen **OR** acetaminophen, camphor-menthol, diphenhydrAMINE, haloperidol lactate, LORazepam, morphine injection, [DISCONTINUED] ondansetron **OR** ondansetron (ZOFRAN) IV, white petrolatum  Palliative Performance Scale: 10% %  Vital Signs: BP 146/96 mmHg  Pulse 79  Temp(Src) 98.6 F (37 C) (Axillary)  Resp 16  Ht '5\' 9"'$  (1.753 m)  Wt 53.978 kg (119 lb)  BMI 17.57 kg/m2  SpO2 93% SpO2: SpO2: 93 % O2 Device: O2 Device: Not Delivered O2 Flow Rate:    Intake/output summary:  Intake/Output Summary (Last 24 hours) at  02/05/15 1008 Last data filed at 02/05/15 0656  Gross per 24 hour  Intake   1728 ml  Output    700 ml  Net   1028 ml   LBM:   Baseline Weight: Weight: 53.978 kg (119 lb) Most recent weight: Weight: 53.978 kg (119 lb)  Physical Exam: General: Minimally responsive, sleeping, cachectic HEENT: Temporal muscle wasting CV: RRR Respiratory: No labored  breathing Abdomen: Soft, Non-tender, Distended Extremities: No edema, MAE Skin: Warm, dry Neuro/Psych: Sleeping - did not awaken              Additional Data Reviewed: Recent Labs     02/03/15  2148  WBC  6.6  HGB  12.9*  PLT  103*  NA  136  BUN  12  CREATININE  0.57     Problem List:  Patient Active Problem List   Diagnosis Date Noted  . Protein-calorie malnutrition, severe 02/04/2015  . Altered level of consciousness 02/03/2015  . Cancer related pain   . Palliative care encounter   . Acute encephalopathy   . Subacute delirium 02/01/2015  . Thrush of mouth and esophagus 01/29/2015  . Nausea without vomiting 01/27/2015  . Anorexia 01/27/2015  . Dehydration 01/27/2015  . Weakness 01/27/2015  . Weight loss 01/27/2015  . Neoplasm related pain 12/21/2014  . Non-small cell cancer of right lung 10/30/2014  . Lung mass 10/14/2014  . Brain metastases 10/14/2014     Palliative Care Assessment & Plan    Code Status:  DNR  Goals of  Care:  Comfort focuses, treat pain, no suffering, DNR, plan for [facility]  Desire for further Chaplaincy support: No  3. Symptom Management:  Pain/headache r/t cancer/leptomeningeal mets: Morphine 2 mg every 3 hours prn (may increase frequency as needed). Decadron 4 mg every 6 hours.   4. Palliative Prophylaxis:  Stool Softner: No - AMS limits po intake. Large BM this morning per mother.   5. Prognosis: days to weeks  5. Discharge Planning: Hopeful for [facility].    Care plan was discussed with Dr. Darrick Meigs  Thank you for allowing the Palliative Medicine Team to assist in  the care of this patient.   Time In: 1530 Time Out: 1600 Total Time 30 Prolonged Time Billed  no     Greater than 50%  of this time was spent counseling and coordinating care related to the above assessment and plan.   Pershing Proud, NP  4/32/0037, 10:08 AM  Please contact Palliative Medicine Team phone at [phone] for questions and concerns.

## 2015-02-05 NOTE — Consult Note (Addendum)
Reason for Consult:leptomeningeal carcinomatosis, family request Referring Physician: Dr. Ernestina Columbia is an 54 y.o. male.  HPI: Patient is a 54 yo male who was diagnosed with non small cell lung cancer metastatic to the brain in December. I have reviewed the films and chart of this patient and was asked for an opinion at the request of the family. Patient has received whole brain and spinal RT and is not a candidate for any other therapy. His most recent scan, though improved in some respects suggests new leptomeningeal spread to basilar cisterns about the cranial nerves. Spinal tap was considered but was not felt to provide any useful information other than confirmation. It is felt that patents best consideration is to provide comfort care at this time.  Past Medical History  Diagnosis Date  . Vertigo   . 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 Video bronchoscopy Bilateral 10/26/2014    Procedure: VIDEO BRONCHOSCOPY WITHOUT FLUORO;  Surgeon: Tanda Rockers, MD;  Location: [facility];  Service: Cardiopulmonary;  Laterality: Bilateral;    Family History  Problem Relation Age of Onset  . Cancer Paternal Grandmother     throat ca    Social History:  reports that he has been smoking Cigarettes.  He has a 37 pack-year smoking history. He has never used smokeless tobacco. He reports that he drinks alcohol. He reports that he does not use illicit drugs.  Allergies:  Allergies  Allergen Reactions  . Oxycodone Rash  . Penicillins Rash    Medications: I have reviewed the patient's current medications.  Results for orders placed or performed during the hospital encounter of 02/01/15 (from the past 48 hour(s))  CBC with Differential/Platelet     Status: Abnormal   Collection Time:  02/03/15  9:48 PM  Result Value Ref Range   WBC 6.6 4.0 - 10.5 K/uL   RBC 3.94 (L) 4.22 - 5.81 MIL/uL   Hemoglobin 12.9 (L) 13.0 - 17.0 g/dL   HCT 36.4 (L) 39.0 - 52.0 %   MCV 92.4 78.0 - 100.0 fL   MCH 32.7 26.0 - 34.0 pg   MCHC 35.4 30.0 - 36.0 g/dL   RDW 15.0 11.5 - 15.5 %   Platelets 103 (L) 150 - 400 K/uL    Comment: SPECIMEN CHECKED FOR CLOTS REPEATED TO VERIFY PLATELET COUNT CONFIRMED BY SMEAR    Neutrophils Relative % 91 (H) 43 - 77 %   Neutro Abs 6.0 1.7 - 7.7 K/uL   Lymphocytes Relative 5 (L) 12 - 46 %   Lymphs Abs 0.3 (L) 0.7 - 4.0 K/uL   Monocytes Relative 4 3 - 12 %   Monocytes Absolute 0.3 0.1 - 1.0 K/uL   Eosinophils Relative 0 0 - 5 %   Eosinophils Absolute 0.0 0.0 - 0.7 K/uL   Basophils Relative 0 0 - 1 %   Basophils Absolute 0.0 0.0 - 0.1 K/uL  Comprehensive metabolic panel     Status: Abnormal   Collection Time: 02/03/15  9:48 PM  Result Value Ref Range   Sodium 136 135 - 145 mmol/L   Potassium 3.7 3.5 - 5.1 mmol/L   Chloride 105 96 - 112 mmol/L   CO2 23 19 - 32 mmol/L   Glucose, Bld 109 (H) 70 - 99  mg/dL   BUN 12 6 - 23 mg/dL   Creatinine, Ser 0.57 0.50 - 1.35 mg/dL   Calcium 8.4 8.4 - 10.5 mg/dL   Total Protein 6.1 6.0 - 8.3 g/dL   Albumin 3.2 (L) 3.5 - 5.2 g/dL   AST 23 0 - 37 U/L   ALT 43 0 - 53 U/L   Alkaline Phosphatase 103 39 - 117 U/L   Total Bilirubin 0.8 0.3 - 1.2 mg/dL   GFR calc non Af Amer >90 >90 mL/min   GFR calc Af Amer >90 >90 mL/min    Comment: (NOTE) The eGFR has been calculated using the CKD EPI equation. This calculation has not been validated in all clinical situations. eGFR's persistently <90 mL/min signify possible Chronic Kidney Disease.    Anion gap 8 5 - 15  Lactic acid, plasma     Status: None   Collection Time: 02/03/15 10:25 PM  Result Value Ref Range   Lactic Acid, Venous 1.1 0.5 - 2.0 mmol/L    Dg Chest Port 1 View  02/03/2015   CLINICAL DATA:  Fever.  Metastatic adenocarcinoma of the lung.  EXAM: PORTABLE  CHEST - 1 VIEW  COMPARISON:  11/09/2014 PET-CT  FINDINGS: Right perihilar mass 6 mm nodule right upper lobe. Aortic atherosclerosis. Heart size upper normal. Interstitial coarsening. No pleural effusion or pneumothorax. Osteopenia. Mild curvature of the spine.  IMPRESSION: Right perihilar mass. Sub cm right upper lobe nodule may relate to be postobstructive pneumonitis or infection described on PET-CT.  Interstitial prominence at least in part reflects chronic change. Superimposed atypical/viral infection not excluded.   Electronically Signed   By: Carlos Levering M.D.   On: 02/03/2015 22:19    Review of Systems  Gastrointestinal: Positive for nausea and vomiting.  Musculoskeletal: Positive for back pain.  Neurological: Positive for focal weakness.       Head ache   Blood pressure 149/84, pulse 78, temperature 97.6 F (36.4 C), temperature source Axillary, resp. rate 20, height $RemoveBe'5\' 9"'[id number]$  (1.753 m), weight 53.978 kg (119 lb), SpO2 96 %. Physical Exam  Constitutional:  Cachectic appearing  HENT:  Head: Normocephalic and atraumatic.  Neurological:  Difficult to arouse with only slight movement of extremities. Pupils 59mm and sluggish.    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  I have discussed his situation with both his parents individually. I have indicated that there is little else to do other than provide comfort and nourishment to the degree he can handle it. They each are overwhelmed by the relentless course of his illness and having difficulty coming to grips with the reality of the situation. I am hopeful that my  words have provided some consolation.  Amelio Brosky J 02/05/2015, 4:07 AM

## 2015-02-05 NOTE — Progress Notes (Signed)
[place] cell phone.   Belia Heman, [place] Work  [employer] [phone]

## 2015-02-05 NOTE — Progress Notes (Signed)
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 Per oncology patient showed progressive worsening. Problems with agitation, judgment and wandering around. Continue Decadron 4 mg IV every 6 hours   Dehydration/failure to thrive  On IV fluids, dietitian consulted for failure to thrive.  Urinary retention Will Place  Foley catheter  Hypokalemia Replaced, potassium 3.7 today  Fever Patient temp of 101.3 last night. Blood cultures were obtained  Generalized weakness  -No evidence of infection, this is likely secondary to metastatic carcinoma and meningeal involvement. -Was on Decadron not for very long time, doubt there is adrenal insufficiency  Delirium/agitation  -Delirium is secondary to leptomeningeal metastasis, cannot rule out radiation cerebritis. -Discontinue EEG as probably it will not change management. -Palliative and hospice care consulted.  Goals of care Discussed in detail with patient's father and mother, they wanted a second opinion with neurosurgery Dr. Ellene Route as well as radiation oncologist Dr. Tammi Klippel. Called and discussed Dr. Tammi Klippel who saw the patient yesterday and explained to the family that patient is currently a terminal stage. He recommended comfort care measures along with hospice. Neurosurgery Dr Ellene Route also saw the patient and recommended the same. At this time both parents are in agreement for hospice placement. Will consult social worker for residential hospice placement.   Code Status: *Full code Family Communication: *Discussed with patient's aunt at bedside. Disposition Plan: To be decided   Consultants:  Palliative care  Procedures:  None  Antibiotics:  None  HPI/Subjective: 54 y.o. male diagnosed with  lung cancer; with brain metastasis. Also questionable metastasis to both the adrenals and bone. Patient is status post whole brain irradiation completed on 11/24/2014. The patient has also had radiation therapy to his left flank/hip area with the last treatment on 01/06/2015. His last cycle of chemotherapy which 01/07/2015. His fourth cycle of chemotherapy was scheduled for 01/28/2015, but has been canceled due to the patient's decline clinically. Since the patient began whole brain radiation, he has been less and less conversant to the point where he only states a few words. He is able to verbalize his needs. The patient has had anorexia with chronic nausea. There has not been any vomiting, diarrhea, fevers, chills, chest pain, shortness breath, dysuria. The patient is unable to provide any reliable history. All of this history is obtained per review of medical records and speaking with the patient's mother and girlfriend at the bedside.  Patient seen and examined, sedated after getting Ativan. Has abdominal distention  Objective: Filed Vitals:   02/05/15 0500  BP: 146/96  Pulse: 79  Temp: 98.6 F (37 C)  Resp: 16    Intake/Output Summary (Last 24 hours) at 02/05/15 1339 Last data filed at 02/05/15 1100  Gross per 24 hour  Intake   1608 ml  Output   2900 ml  Net  -1292 ml   Filed Weights   02/01/15 1652  Weight: 53.978 kg (119 lb)    Exam:   General:  Appear in no acute distress  Cardiovascular: S1S2 RRR  Respiratory: Clear bilaterally  Abdomen: Soft, nontender, distended  Musculoskeletal: No edema of the lower extremities   Data Reviewed: Basic Metabolic Panel:  Recent Labs Lab 02/01/15 1932 02/02/15 0443 02/03/15 2148  NA 134* 133* 136  K 3.5 3.2* 3.7  CL 94* 99 105  CO2 27 26 23  GLUCOSE 93 117* 109*  BUN 24* 19 12  CREATININE 0.62 0.56 0.57  CALCIUM 8.8 8.2* 8.4  MG 2.0  --   --    Liver Function Tests:  Recent Labs Lab 02/01/15 1932 02/03/15 2148   AST 24 23  ALT 37 43  ALKPHOS 102 103  BILITOT 1.0 0.8  PROT 6.6 6.1  ALBUMIN 3.8 3.2*   No results for input(s): LIPASE, AMYLASE in the last 168 hours. No results for input(s): AMMONIA in the last 168 hours. CBC:  Recent Labs Lab 02/01/15 1932 02/03/15 2148  WBC 7.2 6.6  NEUTROABS  --  6.0  HGB 13.5 12.9*  HCT 37.9* 36.4*  MCV 91.5 92.4  PLT 122* 103*   Cardiac Enzymes: No results for input(s): CKTOTAL, CKMB, CKMBINDEX, TROPONINI in the last 168 hours. BNP (last 3 results) No results for input(s): BNP in the last 8760 hours.  ProBNP (last 3 results) No results for input(s): PROBNP in the last 8760 hours.  CBG: No results for input(s): GLUCAP in the last 168 hours.  Recent Results (from the past 240 hour(s))  Urine culture     Status: None   Collection Time: 02/02/15  6:11 AM  Result Value Ref Range Status   Specimen Description URINE, RANDOM  Final   Special Requests NONE  Final   Colony Count   Final    3,000 COLONIES/ML Performed at [facility]    Culture   Final    INSIGNIFICANT GROWTH Performed at [facility]    Report Status 02/03/2015 FINAL  Final  Culture, blood (routine x 2)     Status: None (Preliminary result)   Collection Time: 02/03/15  9:47 PM  Result Value Ref Range Status   Specimen Description BLOOD LEFT HAND  Final   Special Requests BOTTLES DRAWN AEROBIC ONLY 3CC  Final   Culture   Final           BLOOD CULTURE RECEIVED NO GROWTH TO DATE CULTURE WILL BE HELD FOR 5 DAYS BEFORE ISSUING A FINAL NEGATIVE REPORT Performed at [facility]    Report Status PENDING  Incomplete  Culture, blood (routine x 2)     Status: None (Preliminary result)   Collection Time: 02/03/15  9:52 PM  Result Value Ref Range Status   Specimen Description BLOOD RIGHT FOREARM  Final   Special Requests BOTTLES DRAWN AEROBIC AND ANAEROBIC 4CC  Final   Culture   Final           BLOOD CULTURE RECEIVED NO GROWTH TO DATE CULTURE WILL BE HELD  FOR 5 DAYS BEFORE ISSUING A FINAL NEGATIVE REPORT Performed at [facility]    Report Status PENDING  Incomplete     Studies: Dg Chest Port 1 View  02/03/2015   CLINICAL DATA:  Fever.  Metastatic adenocarcinoma of the lung.  EXAM: PORTABLE CHEST - 1 VIEW  COMPARISON:  11/09/2014 PET-CT  FINDINGS: Right perihilar mass 6 mm nodule right upper lobe. Aortic atherosclerosis. Heart size upper normal. Interstitial coarsening. No pleural effusion or pneumothorax. Osteopenia. Mild curvature of the spine.  IMPRESSION: Right perihilar mass. Sub cm right upper lobe nodule may relate to be postobstructive pneumonitis or infection described on PET-CT.  Interstitial prominence at least in part reflects chronic change. Superimposed atypical/viral infection not excluded.   Electronically Signed   By: Carlos Levering M.D.   On: 02/03/2015 22:19    Scheduled Meds: . dexamethasone  4 mg Intravenous 4 times per  day  . enoxaparin (LOVENOX) injection  40 mg Subcutaneous Q24H  . famotidine (PEPCID) IV  20 mg Intravenous Q24H  . feeding supplement (ENSURE ENLIVE)  237 mL Oral BID BM  . pantoprazole (PROTONIX) IV  40 mg Intravenous Q24H   Continuous Infusions: . dextrose 5 % and 0.9% NaCl 100 mL/hr at 02/05/15 4765    Active Problems:   Non-small cell cancer of right lung   Anorexia   Dehydration   Weakness   Weight loss   Subacute delirium   Cancer related pain   Palliative care encounter   Acute encephalopathy   Protein-calorie malnutrition, severe    Time spent: *25 min    [facility] Pager [phone]. If 7PM-7AM, please contact night-coverage at [email or web address], password [facility] 02/05/2015, 1:39 PM  LOS: 4 days            T

## 2015-02-06 NOTE — Progress Notes (Signed)
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 Per oncology patient showed progressive worsening. Problems with agitation, judgment and wandering around. Continue Decadron 4 mg IV every 6 hours  Urinary retention   Foley catheter placed  Generalized weakness  -No evidence of infection, this is likely secondary to metastatic carcinoma and meningeal involvement. -Was on Decadron not for very long time, doubt there is adrenal insufficiency  Delirium/agitation  -Delirium is secondary to leptomeningeal metastasis, cannot rule out radiation cerebritis. -Discontinue EEG as probably it will not change management. -Palliative and hospice care consulted.  Goals of care Discussed in detail with patient's father and mother, they wanted a second opinion with neurosurgery Dr. Ellene Route as well as radiation oncologist Dr. Tammi Klippel. Called and discussed Dr. Tammi Klippel who saw the patient yesterday and explained to the family that patient is currently a terminal stage. He recommended comfort care measures along with hospice. Neurosurgery Dr Ellene Route also saw the patient and recommended the same. At this time both parents are in agreement for hospice placement. Will consult social worker for residential hospice placement.   Code Status: *Full code Family Communication: *Discussed with patient's mother at bedside on 02/05/15 Disposition Plan: Awaiting to go to residential hospice   Consultants:  Palliative care  Procedures:  None  Antibiotics:  None  HPI/Subjective: 54 y.o. male diagnosed with lung cancer; with brain metastasis. Also questionable metastasis to both the adrenals and bone. Patient is status post whole brain irradiation completed on 11/24/2014. The patient  has also had radiation therapy to his left flank/hip area with the last treatment on 01/06/2015. His last cycle of chemotherapy which 01/07/2015. His fourth cycle of chemotherapy was scheduled for 01/28/2015, but has been canceled due to the patient's decline clinically. Since the patient began whole brain radiation, he has been less and less conversant to the point where he only states a few words. He is able to verbalize his needs. The patient has had anorexia with chronic nausea. There has not been any vomiting, diarrhea, fevers, chills, chest pain, shortness breath, dysuria. The patient is unable to provide any reliable history. All of this history is obtained per review of medical records and speaking with the patient's mother and girlfriend at the bedside.  Patient seen and examined, lethargic. Inserted foley catheter yesterday for urinary retention.  Objective: Filed Vitals:   02/05/15 0500  BP: 146/96  Pulse: 79  Temp: 98.6 F (37 C)  Resp: 16    Intake/Output Summary (Last 24 hours) at 02/06/15 1225 Last data filed at 02/06/15 1004  Gross per 24 hour  Intake   1605 ml  Output   1450 ml  Net    155 ml   Filed Weights   02/01/15 1652  Weight: 53.978 kg (119 lb)    Exam:   General:  Appear in no acute distress  Cardiovascular: S1S2 RRR  Respiratory: Clear bilaterally  Abdomen: Soft, nontender, distended  Musculoskeletal: No edema of the lower extremities   Data Reviewed: Basic Metabolic Panel:  Recent Labs Lab 02/01/15 1932 02/02/15 0443 02/03/15 2148  NA 134* 133* 136  K 3.5 3.2* 3.7  CL 94* 99 105  CO2 '27 26 23  '$ GLUCOSE 93 117* 109*  BUN 24* 19 12  CREATININE 0.62 0.56 0.57  CALCIUM 8.8 8.2* 8.4  MG 2.0  --   --  Liver Function Tests:  Recent Labs Lab 02/01/15 1932 02/03/15 2148  AST 24 23  ALT 37 43  ALKPHOS 102 103  BILITOT 1.0 0.8  PROT 6.6 6.1  ALBUMIN 3.8 3.2*   No results for input(s): LIPASE, AMYLASE in the last 168 hours. No  results for input(s): AMMONIA in the last 168 hours. CBC:  Recent Labs Lab 02/01/15 1932 02/03/15 2148  WBC 7.2 6.6  NEUTROABS  --  6.0  HGB 13.5 12.9*  HCT 37.9* 36.4*  MCV 91.5 92.4  PLT 122* 103*   Cardiac Enzymes: No results for input(s): CKTOTAL, CKMB, CKMBINDEX, TROPONINI in the last 168 hours. BNP (last 3 results) No results for input(s): BNP in the last 8760 hours.  ProBNP (last 3 results) No results for input(s): PROBNP in the last 8760 hours.  CBG: No results for input(s): GLUCAP in the last 168 hours.  Recent Results (from the past 240 hour(s))  Urine culture     Status: None   Collection Time: 02/02/15  6:11 AM  Result Value Ref Range Status   Specimen Description URINE, RANDOM  Final   Special Requests NONE  Final   Colony Count   Final    3,000 COLONIES/ML Performed at [facility]    Culture   Final    INSIGNIFICANT GROWTH Performed at [facility]    Report Status 02/03/2015 FINAL  Final  Culture, blood (routine x 2)     Status: None (Preliminary result)   Collection Time: 02/03/15  9:47 PM  Result Value Ref Range Status   Specimen Description BLOOD LEFT HAND  Final   Special Requests BOTTLES DRAWN AEROBIC ONLY 3CC  Final   Culture   Final           BLOOD CULTURE RECEIVED NO GROWTH TO DATE CULTURE WILL BE HELD FOR 5 DAYS BEFORE ISSUING A FINAL NEGATIVE REPORT Performed at [facility]    Report Status PENDING  Incomplete  Culture, blood (routine x 2)     Status: None (Preliminary result)   Collection Time: 02/03/15  9:52 PM  Result Value Ref Range Status   Specimen Description BLOOD RIGHT FOREARM  Final   Special Requests BOTTLES DRAWN AEROBIC AND ANAEROBIC 4CC  Final   Culture   Final           BLOOD CULTURE RECEIVED NO GROWTH TO DATE CULTURE WILL BE HELD FOR 5 DAYS BEFORE ISSUING A FINAL NEGATIVE REPORT Performed at [facility]    Report Status PENDING  Incomplete     Studies: No results  found.  Scheduled Meds: . dexamethasone  4 mg Intravenous 4 times per day  . enoxaparin (LOVENOX) injection  40 mg Subcutaneous Q24H  . famotidine (PEPCID) IV  20 mg Intravenous Q24H  . feeding supplement (ENSURE ENLIVE)  237 mL Oral BID BM  . pantoprazole (PROTONIX) IV  40 mg Intravenous Q24H   Continuous Infusions: . dextrose 5 % and 0.9% NaCl 100 mL/hr at 02/06/15 0446    Active Problems:   Non-small cell cancer of right lung   Anorexia   Dehydration   Weakness   Weight loss   Subacute delirium   Cancer related pain   Palliative care encounter   Acute encephalopathy   Protein-calorie malnutrition, severe   Cancer with leptomeningeal spread    Time spent: *25 min    [facility] Pager [phone]. If 7PM-7AM, please contact night-coverage at [email or web address], password [facility] 02/06/2015, 12:25 PM  LOS: 5 days            T

## 2015-02-07 NOTE — Clinical Social Work Note (Signed)
CSW met with pt's parents at bedside to discuss hospice services.  CSW prompted pt's parents to discuss needs.  Pt's father stated that he toured [facility] facility on Friday and he thought he was given conflicting information from hospital stating he was told "the same medication he (pt) gets here (hospital) he will get at hospice"  Pt's father and mother stated that pt was fine 3 months ago and this just came on so they are "trying to adjust so they can make some decisions"  CSW encouraged pt's family to discuss choices for facility at discharge but family stated that they do not want to make any decisions at this time and want to think about things  Pt's father stated that they will "probably choose [facility]" but they do not want to make a decision today  CSW spoke with Harmon Pier at [facility] to confirm what pt's family stated and she stated she wiill touch base with family during weekday to explore possible placement  .Dede Query, LCSW [facility] Clinical Social Worker - Weekend Coverage cell #: [phone]

## 2015-02-07 NOTE — Progress Notes (Signed)
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 Per oncology patient showed progressive worsening. Problems with agitation, judgment and wandering around. Continue Decadron 4 mg IV every 6 hours Will switch to po decadron at the time of discharge  Urinary retention Foley catheter placed Resolved  Generalized weakness  -No evidence of infection, this is likely secondary to metastatic carcinoma and meningeal involvement.  Delirium/agitation  Improved, continue haldol and ativan prn -Delirium is secondary to leptomeningeal metastasis, cannot rule out radiation cerebritis. -Discontinue EEG as probably it will not change management. -Palliative and hospice care consulted.  Goals of care Discussed in detail with patient's father and mother, they wanted a second opinion with neurosurgery Dr. Ellene Route as well as radiation oncologist Dr. Tammi Klippel. Called and discussed Dr. Tammi Klippel who saw the patient yesterday and explained to the family that patient is currently a terminal stage. He recommended comfort care measures along with hospice. Neurosurgery Dr Ellene Route also saw the patient and recommended the same. At this time both parents are in agreement for hospice placement. Will consult social worker for residential hospice placement.   Code Status: *Full code Family Communication: *Discussed with patient's mother at bedside on 02/05/15 Disposition Plan: Awaiting to go to residential hospice   Consultants:  Palliative care  Procedures:  None  Antibiotics:  None  HPI/Subjective: 54 y.o. male diagnosed with lung cancer; with brain metastasis. Also questionable metastasis to both the adrenals and bone. Patient is status post whole brain irradiation completed on  11/24/2014. The patient has also had radiation therapy to his left flank/hip area with the last treatment on 01/06/2015. His last cycle of chemotherapy which 01/07/2015. His fourth cycle of chemotherapy was scheduled for 01/28/2015, but has been canceled due to the patient's decline clinically. Since the patient began whole brain radiation, he has been less and less conversant to the point where he only states a few words. He is able to verbalize his needs. The patient has had anorexia with chronic nausea. There has not been any vomiting, diarrhea, fevers, chills, chest pain, shortness breath, dysuria. The patient is unable to provide any reliable history. All of this history is obtained per review of medical records and speaking with the patient's mother and girlfriend at the bedside.  Patient seen and examined, lethargic. Father at bedside  Objective: Filed Vitals:   02/07/15 0639  BP: 140/88  Pulse: 99  Temp: 97.4 F (36.3 C)  Resp: 24    Intake/Output Summary (Last 24 hours) at 02/07/15 1227 Last data filed at 02/07/15 0656  Gross per 24 hour  Intake    800 ml  Output   1250 ml  Net   -450 ml   Filed Weights   02/01/15 1652  Weight: 53.978 kg (119 lb)    Exam:   General:  Appear in no acute distress  Cardiovascular: S1S2 RRR  Respiratory: Clear bilaterally  Abdomen: Soft, nontender, distended  Musculoskeletal: No edema of the lower extremities   Data Reviewed: Basic Metabolic Panel:  Recent Labs Lab 02/01/15 1932 02/02/15 0443 02/03/15 2148  NA 134* 133* 136  K 3.5 3.2* 3.7  CL 94* 99 105  CO2 '27 26 23  '$ GLUCOSE 93 117* 109*  BUN 24* 19 12  CREATININE 0.62 0.56 0.57  CALCIUM 8.8 8.2* 8.4  MG 2.0  --   --  Liver Function Tests:  Recent Labs Lab 02/01/15 1932 02/03/15 2148  AST 24 23  ALT 37 43  ALKPHOS 102 103  BILITOT 1.0 0.8  PROT 6.6 6.1  ALBUMIN 3.8 3.2*   No results for input(s): LIPASE, AMYLASE in the last 168 hours. No results for  input(s): AMMONIA in the last 168 hours. CBC:  Recent Labs Lab 02/01/15 1932 02/03/15 2148  WBC 7.2 6.6  NEUTROABS  --  6.0  HGB 13.5 12.9*  HCT 37.9* 36.4*  MCV 91.5 92.4  PLT 122* 103*   Cardiac Enzymes: No results for input(s): CKTOTAL, CKMB, CKMBINDEX, TROPONINI in the last 168 hours. BNP (last 3 results) No results for input(s): BNP in the last 8760 hours.  ProBNP (last 3 results) No results for input(s): PROBNP in the last 8760 hours.  CBG: No results for input(s): GLUCAP in the last 168 hours.  Recent Results (from the past 240 hour(s))  Urine culture     Status: None   Collection Time: 02/02/15  6:11 AM  Result Value Ref Range Status   Specimen Description URINE, RANDOM  Final   Special Requests NONE  Final   Colony Count   Final    3,000 COLONIES/ML Performed at [facility]    Culture   Final    INSIGNIFICANT GROWTH Performed at [facility]    Report Status 02/03/2015 FINAL  Final  Culture, blood (routine x 2)     Status: None (Preliminary result)   Collection Time: 02/03/15  9:47 PM  Result Value Ref Range Status   Specimen Description BLOOD LEFT HAND  Final   Special Requests BOTTLES DRAWN AEROBIC ONLY 3CC  Final   Culture   Final           BLOOD CULTURE RECEIVED NO GROWTH TO DATE CULTURE WILL BE HELD FOR 5 DAYS BEFORE ISSUING A FINAL NEGATIVE REPORT Performed at [facility]    Report Status PENDING  Incomplete  Culture, blood (routine x 2)     Status: None (Preliminary result)   Collection Time: 02/03/15  9:52 PM  Result Value Ref Range Status   Specimen Description BLOOD RIGHT FOREARM  Final   Special Requests BOTTLES DRAWN AEROBIC AND ANAEROBIC 4CC  Final   Culture   Final           BLOOD CULTURE RECEIVED NO GROWTH TO DATE CULTURE WILL BE HELD FOR 5 DAYS BEFORE ISSUING A FINAL NEGATIVE REPORT Performed at [facility]    Report Status PENDING  Incomplete     Studies: No results found.  Scheduled Meds: .  dexamethasone  4 mg Intravenous 4 times per day  . enoxaparin (LOVENOX) injection  40 mg Subcutaneous Q24H  . famotidine (PEPCID) IV  20 mg Intravenous Q24H  . feeding supplement (ENSURE ENLIVE)  237 mL Oral BID BM  . pantoprazole (PROTONIX) IV  40 mg Intravenous Q24H   Continuous Infusions: . dextrose 5 % and 0.9% NaCl 100 mL/hr at 02/06/15 2257    Active Problems:   Non-small cell cancer of right lung   Anorexia   Dehydration   Weakness   Weight loss   Subacute delirium   Cancer related pain   Palliative care encounter   Acute encephalopathy   Protein-calorie malnutrition, severe   Cancer with leptomeningeal spread    Time spent: *25 min    [facility] Pager [phone]. If 7PM-7AM, please contact night-coverage at [email or web address], password [facility] 02/07/2015, 12:27 PM  LOS: 6 days            T

## 2015-02-08 MED ORDER — MORPHINE SULFATE (CONCENTRATE) 10 MG /0.5 ML PO SOLN: 5.0000 mg | ORAL | Status: AC | PRN

## 2015-02-08 MED ORDER — SCOPOLAMINE 1 MG/3DAYS TD PT72
1.0000 | MEDICATED_PATCH | TRANSDERMAL | Status: DC
  Administered 2015-02-08: 1.5 mg via TRANSDERMAL
  Filled 2015-02-08: qty 1

## 2015-02-08 MED ORDER — SCOPOLAMINE 1 MG/3DAYS TD PT72: 1.0000 | MEDICATED_PATCH | TRANSDERMAL | Status: AC

## 2015-02-08 MED ORDER — LORAZEPAM 1 MG PO TABS: 1.0000 mg | ORAL_TABLET | ORAL | Status: AC | PRN

## 2015-02-08 NOTE — Progress Notes (Signed)
CSW continuing to follow.  CSW received notification from [facility], Erling Conte that hospice home has bed available for pt today.  CSW notified MD who spoke with family and pt family agreeable to transition to [facility] today.   CSW facilitated pt discharge needs including discussing with [facility] liaison, Erling Conte, faxing pt discharge information to [facility], discussing with pt mother and family members at bedside, providing RN phone number to call report, and arranging ambulance transportation for pt to [facility].  Pt family emotional about transfer to [facility] and pt poor prognosis. Pt has strong family support.  No further social work needs identified at this time.  CSW signing off.   Alison Murray, MSW, [place] Work [phone]

## 2015-02-08 NOTE — Discharge Summary (Signed)
Physician Discharge Summary  Jason Keller HER:[id number] DOB: [birth year] DOA: 02/01/2015  PCP: No PCP Per Patient  Admit date: 02/01/2015 Discharge date: 02/08/2015  Time spent: 35* minutes  Recommendations for Outpatient Follow-up:  1. Patient to be discharged to residential hospice for end of life care   Discharge Diagnoses:  Active Problems:   Non-small cell cancer of right lung   Anorexia   Dehydration   Weakness   Weight loss   Subacute delirium   Cancer related pain   Palliative care encounter   Acute encephalopathy   Protein-calorie malnutrition, severe   Cancer with leptomeningeal spread   Discharge Condition: Stable  Diet recommendation: comfort feed  Filed Weights   02/01/15 1652  Weight: 53.978 kg (119 lb)    History of present illness:  54 y.o. male diagnosed with lung cancer; with brain metastasis. Also questionable metastasis to both the adrenals and bone. Patient is status post whole brain irradiation completed on 11/24/2014. The patient has also had radiation therapy to his left flank/hip area with the last treatment on 01/06/2015. His last cycle of chemotherapy which 01/07/2015. His fourth cycle of chemotherapy was scheduled for 01/28/2015, but has been canceled due to the patient's decline clinically. Since the patient began whole brain radiation, he has been less and less conversant to the point where he only states a few words. He is able to verbalize his needs. The patient has had anorexia with chronic nausea. There has not been any vomiting, diarrhea, fevers, chills, chest pain, shortness breath, dysuria. The patient is unable to provide any reliable history. All of this history is obtained per review of medical records and speaking with the patient's mother and girlfriend at the bedside.  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 Per  oncology patient showed progressive worsening. Problems with agitation, judgment and wandering around. Patient was started on  Decadron 4 mg IV every 6 hours Will switch to po decadron at the time of discharge  Urinary retention Foley catheter placed Resolved  Generalized weakness  -No evidence of infection, this is likely secondary to metastatic carcinoma and meningeal involvement.  Delirium/agitation  Improved, continue haldol and ativan prn -Delirium is secondary to leptomeningeal metastasis, cannot rule out radiation cerebritis. -Discontinue EEG as probably it will not change management. - improved with ativan  Goals of care Discussed in detail with patient's father and mother, they wanted a second opinion with neurosurgery Dr. Ellene Route as well as radiation oncologist Dr. Tammi Klippel. Called and discussed Dr. Tammi Klippel who saw the patient yesterday and explained to the family that patient is currently a terminal stage. He recommended comfort care measures along with hospice.  Neurosurgery Dr Ellene Route also saw the patient and recommended the same. At this time both parents are in agreement for hospice placement.   Procedures:  None  Consultations:  None  Discharge Exam: Filed Vitals:   02/07/15 2102  BP: 157/87  Pulse: 87  Temp: 97.9 F (36.6 C)  Resp: 16    General: Lethargic, but no acute distress Cardiovascular: S1s2 RRR Respiratory: Clear bilaterally  Discharge Instructions   Discharge Instructions    Diet - low sodium heart healthy    Complete by:  As directed      Increase activity slowly    Complete by:  As directed           Current Discharge Medication List    START taking these medications  Details  LORazepam (ATIVAN) 1 MG tablet Take 1 tablet (1 mg total) by mouth every 4 (four) hours as needed for anxiety. Qty: 30 tablet, Refills: 0    Morphine Sulfate (MORPHINE CONCENTRATE) 10 mg / 0.5 ml concentrated solution Place 0.25 mLs (5 mg total) under  the tongue every 3 (three) hours as needed for severe pain. Qty: 120 mL, Refills: 0    scopolamine (TRANSDERM-SCOP) 1 MG/3DAYS Place 1 patch (1.5 mg total) onto the skin every 3 (three) days. Qty: 10 patch, Refills: 12      CONTINUE these medications which have NOT CHANGED   Details  Alum & Mag Hydroxide-Simeth (MAGIC MOUTHWASH) SOLN Take 5 mLs by mouth 4 (four) times daily as needed for mouth pain ([employer] mouthwash w/ nystatin.). Qty: 240 mL, Refills: 1   Associated Diagnoses: Non-small cell cancer of right lung    dexamethasone (DECADRON) 4 MG tablet Take 1 tablet (4 mg total) by mouth 2 (two) times daily. Qty: 60 tablet, Refills: 1    senna (SENOKOT) 8.6 MG tablet Take 2 tablets by mouth 2 (two) times daily.    ondansetron (ZOFRAN ODT) 8 MG disintegrating tablet Take 1 tablet (8 mg total) by mouth every 8 (eight) hours as needed for nausea or vomiting. Qty: 30 tablet, Refills: 1   Associated Diagnoses: Non-small cell cancer of right lung; Cigarette smoker; Neoplasm related pain; Nausea without vomiting; Anorexia; Dehydration; Weakness; Weight loss      STOP taking these medications     famotidine (PEPCID) 20 MG tablet      folic acid (FOLVITE) 1 MG tablet      morphine (MSIR) 15 MG tablet      pantoprazole (PROTONIX) 40 MG tablet      polyethylene glycol (MIRALAX / GLYCOLAX) packet      prochlorperazine (COMPAZINE) 10 MG tablet      fluconazole (DIFLUCAN) 100 MG tablet      HYDROcodone-acetaminophen (NORCO/VICODIN) 5-325 MG per tablet        Allergies  Allergen Reactions  . Oxycodone Rash  . Penicillins Rash      The results of significant diagnostics from this hospitalization (including imaging, microbiology, ancillary and laboratory) are listed below for reference.    Significant Diagnostic Studies: Dg Chest Port 1 View  02/03/2015   CLINICAL DATA:  Fever.  Metastatic adenocarcinoma of the lung.  EXAM: PORTABLE CHEST - 1 VIEW  COMPARISON:  11/09/2014 PET-CT   FINDINGS: Right perihilar mass 6 mm nodule right upper lobe. Aortic atherosclerosis. Heart size upper normal. Interstitial coarsening. No pleural effusion or pneumothorax. Osteopenia. Mild curvature of the spine.  IMPRESSION: Right perihilar mass. Sub cm right upper lobe nodule may relate to be postobstructive pneumonitis or infection described on PET-CT.  Interstitial prominence at least in part reflects chronic change. Superimposed atypical/viral infection not excluded.   Electronically Signed   By: Carlos Levering M.D.   On: 02/03/2015 22:19    Microbiology: Recent Results (from the past 240 hour(s))  Urine culture     Status: None   Collection Time: 02/02/15  6:11 AM  Result Value Ref Range Status   Specimen Description URINE, RANDOM  Final   Special Requests NONE  Final   Colony Count   Final    3,000 COLONIES/ML Performed at [facility]    Culture   Final    INSIGNIFICANT GROWTH Performed at [facility]    Report Status 02/03/2015 FINAL  Final  Culture, blood (routine x 2)  Status: None (Preliminary result)   Collection Time: 02/03/15  9:47 PM  Result Value Ref Range Status   Specimen Description BLOOD LEFT HAND  Final   Special Requests BOTTLES DRAWN AEROBIC ONLY 3CC  Final   Culture   Final           BLOOD CULTURE RECEIVED NO GROWTH TO DATE CULTURE WILL BE HELD FOR 5 DAYS BEFORE ISSUING A FINAL NEGATIVE REPORT Performed at [facility]    Report Status PENDING  Incomplete  Culture, blood (routine x 2)     Status: None (Preliminary result)   Collection Time: 02/03/15  9:52 PM  Result Value Ref Range Status   Specimen Description BLOOD RIGHT FOREARM  Final   Special Requests BOTTLES DRAWN AEROBIC AND ANAEROBIC 4CC  Final   Culture   Final           BLOOD CULTURE RECEIVED NO GROWTH TO DATE CULTURE WILL BE HELD FOR 5 DAYS BEFORE ISSUING A FINAL NEGATIVE REPORT Performed at [facility]    Report Status PENDING  Incomplete      Labs: Basic Metabolic Panel:  Recent Labs Lab 02/01/15 1932 02/02/15 0443 02/03/15 2148  NA 134* 133* 136  K 3.5 3.2* 3.7  CL 94* 99 105  CO2 '27 26 23  '$ GLUCOSE 93 117* 109*  BUN 24* 19 12  CREATININE 0.62 0.56 0.57  CALCIUM 8.8 8.2* 8.4  MG 2.0  --   --    Liver Function Tests:  Recent Labs Lab 02/01/15 1932 02/03/15 2148  AST 24 23  ALT 37 43  ALKPHOS 102 103  BILITOT 1.0 0.8  PROT 6.6 6.1  ALBUMIN 3.8 3.2*   No results for input(s): LIPASE, AMYLASE in the last 168 hours. No results for input(s): AMMONIA in the last 168 hours. CBC:  Recent Labs Lab 02/01/15 1932 02/03/15 2148  WBC 7.2 6.6  NEUTROABS  --  6.0  HGB 13.5 12.9*  HCT 37.9* 36.4*  MCV 91.5 92.4  PLT 122* 103*   Cardiac Enzymes: No results for input(s): CKTOTAL, CKMB, CKMBINDEX, TROPONINI in the last 168 hours. BNP: BNP (last 3 results) No results for input(s): BNP in the last 8760 hours.  ProBNP (last 3 results) No results for input(s): PROBNP in the last 8760 hours.  CBG: No results for input(s): GLUCAP in the last 168 hours.     SignedEleonore Chiquito S  [facility] 02/08/2015, 11:25 AM

## 2015-02-08 NOTE — Progress Notes (Signed)
Report called to [facility], pt picked up by [facility] at 1245/ VS at discharge T 98.1 (ax) O2 sat 83% on room air, placed on O2 for transport. Respirations 36, HR 144. Morphine 2 mg iv given at 1220, and Haldol 2 mg given at 1240, both iv. Iv d/c'ed. Pt made comfortable. Report called.

## 2015-02-08 NOTE — Consult Note (Signed)
[facility] Liaison: [employer] available for Mr. Irani today. Registration paper work completed by patient's mother Butch Penny and patient's son Landry Mellow. They have made patient's father Delrae Alfred aware. Dr. Orpah Melter to assume care at their request. Please fax discharge summary to [phone]. RN please call report to [phone]. Thank you. Erling Conte LCSW [phone]

## 2015-02-10 LAB — CULTURE, BLOOD (ROUTINE X 2)
CULTURE: NO GROWTH
Culture: NO GROWTH

## 2015-02-11 ENCOUNTER — Other Ambulatory Visit: Payer: PRIVATE HEALTH INSURANCE

## 2015-02-14 DEATH — deceased

## 2015-02-20 ENCOUNTER — Other Ambulatory Visit: Payer: Self-pay | Admitting: Internal Medicine

## 2016-04-04 ENCOUNTER — Other Ambulatory Visit: Payer: Self-pay | Admitting: Nurse Practitioner

## 2016-05-25 IMAGING — CT CT CHEST W/ CM
2 of 4 series · 15 of 36 positions shown, 18 images · IV contrast (Omnipaque 300)
Comparison: Chest radiograph of 10/08/2014. Brain MRI of
10/08/2014.

CLINICAL DATA: Brain lesions on MRI. Right upper lobe lung mass on
CT. Two month history of vertigo.

EXAM:
CT CHEST WITH CONTRAST
TECHNIQUE: Multidetector CT imaging of the chest was performed during
intravenous contrast administration.
CONTRAST:  80mL OMNIPAQUE IOHEXOL 300 MG/ML  SOLN

[Series 2: chest routine with · axial · 0.69mm/px · z∈[-292,-42]mm · 12 of 60 slices shown, 15 images]
[im 5/60  mediastinal]
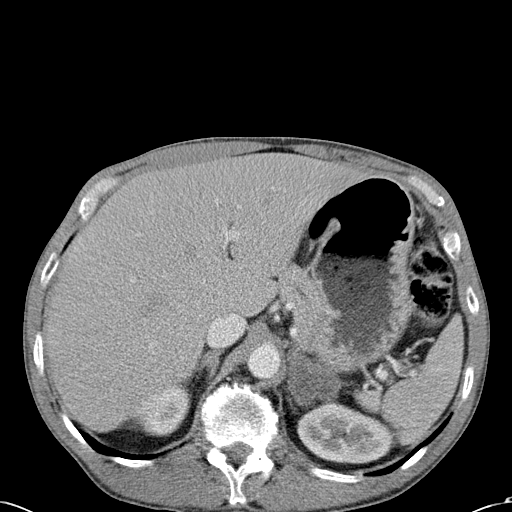
[im 5/60  lung]
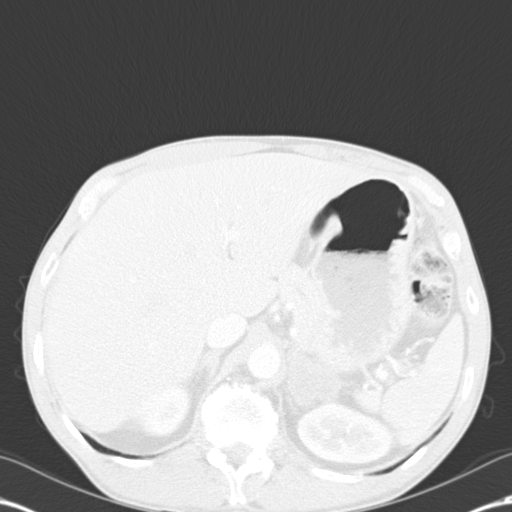
[im 10/60  lung]
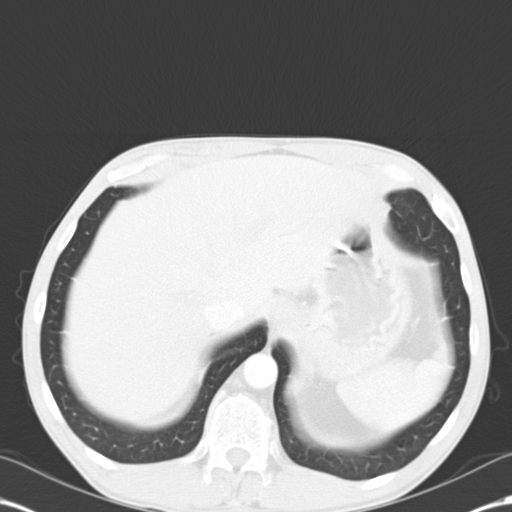
[im 14/60  lung]
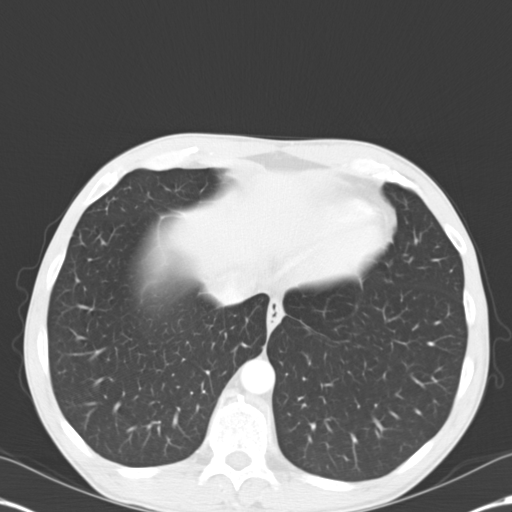
[im 19/60  lung]
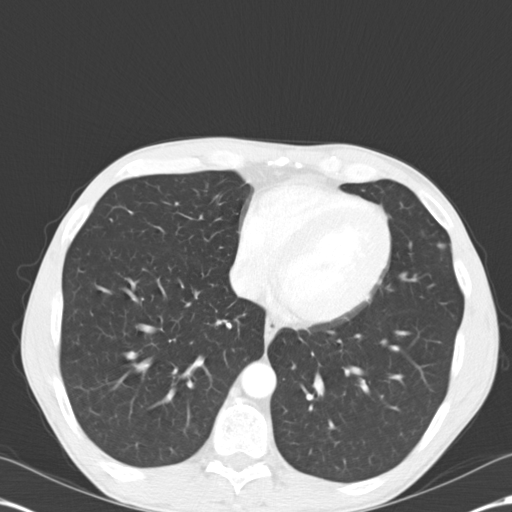
[im 23/60  mediastinal]
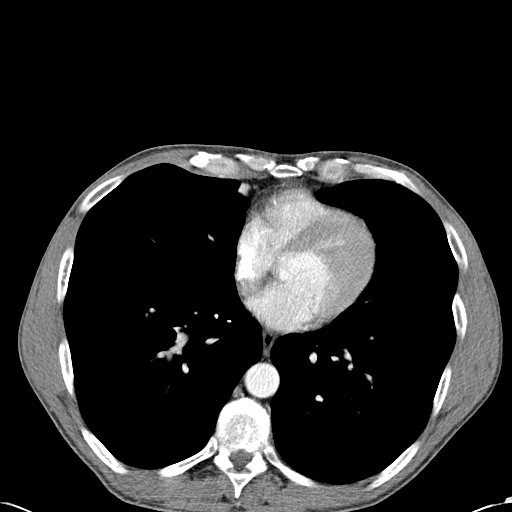
[im 23/60  lung]
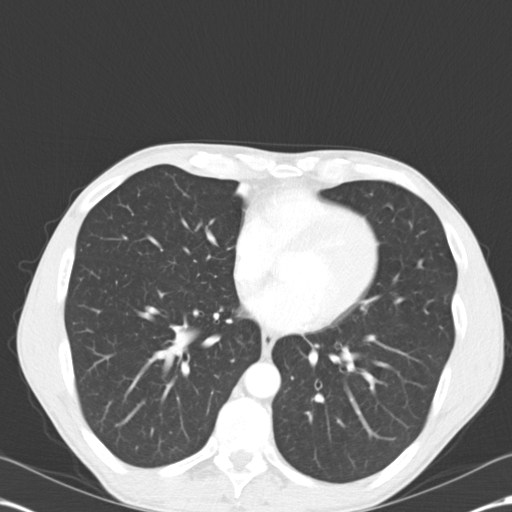
[im 28/60  lung]
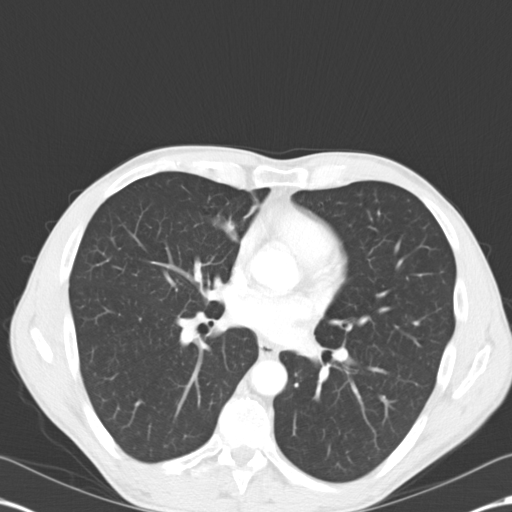
[im 32/60  lung]
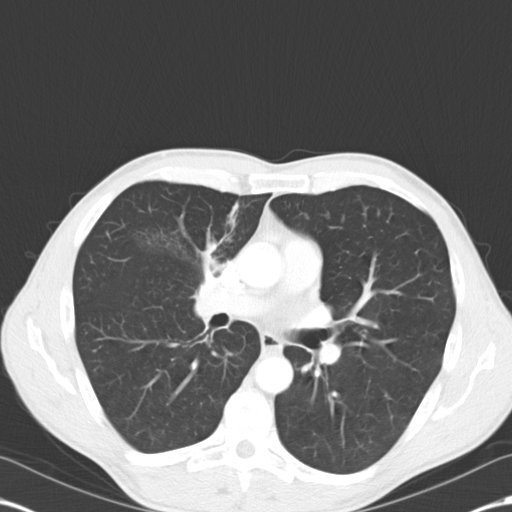
[im 37/60  lung]
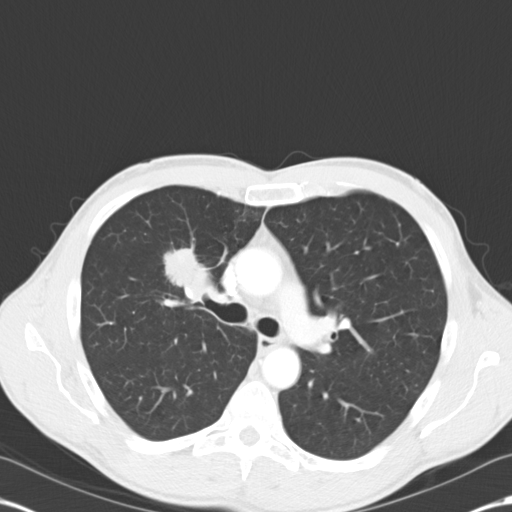
[im 41/60  mediastinal]
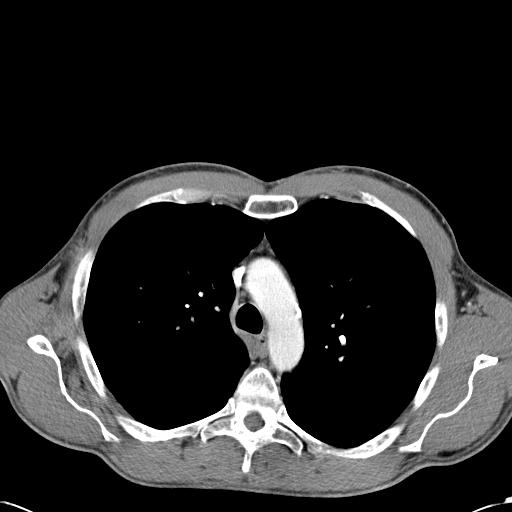
[im 41/60  lung]
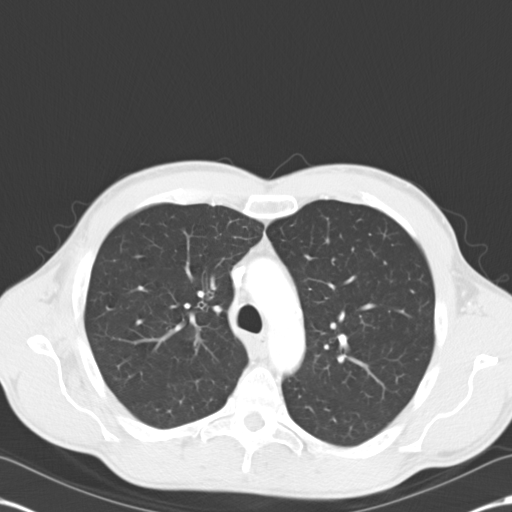
[im 46/60  lung]
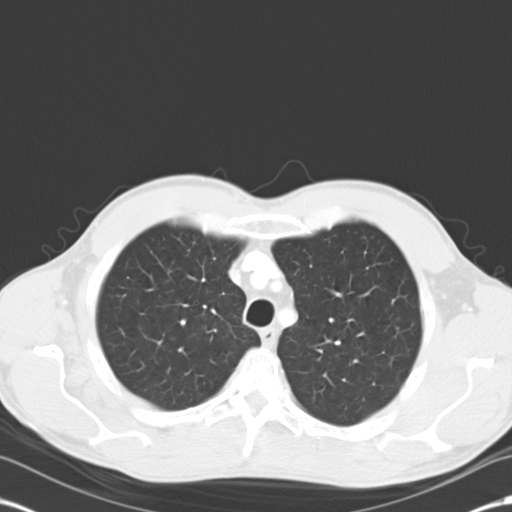
[im 50/60  lung]
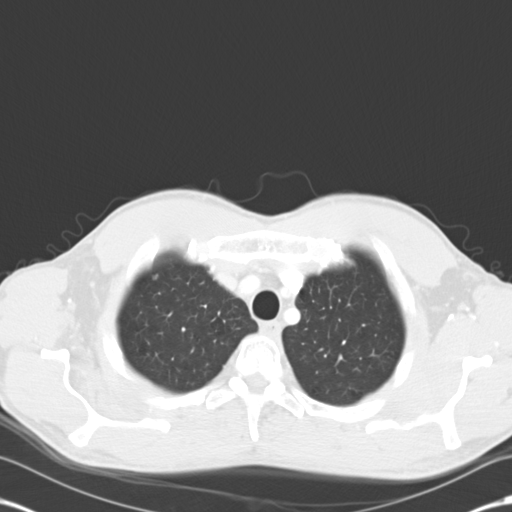
[im 55/60  lung]
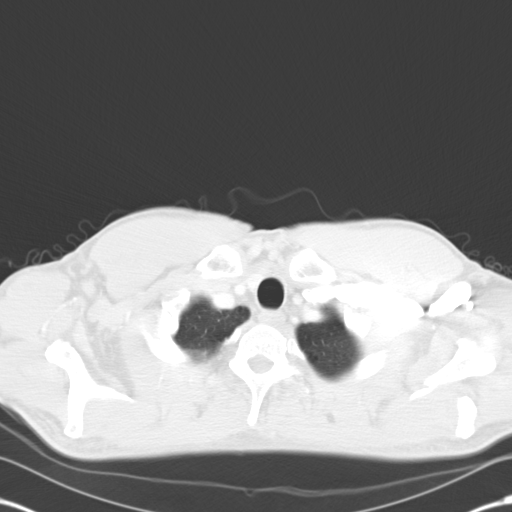

[Series 602: cor · coronal · 0.69mm/px · 3 of 118 slices shown]
[im 24/118  lung]
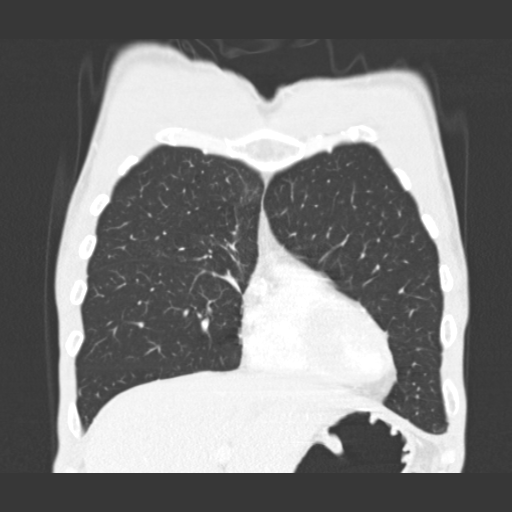
[im 47/118  lung]
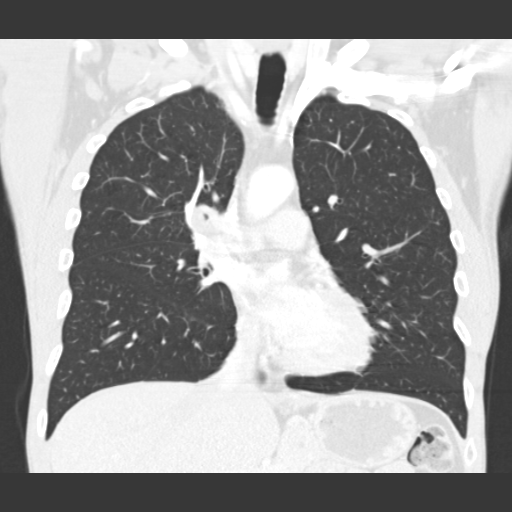
[im 71/118  lung]
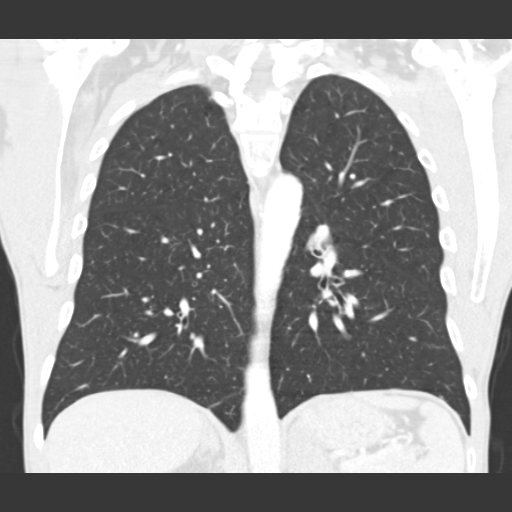

[15 of 36 positions shown; findings below may reference images not displayed]

FINDINGS: Mediastinum/Nodes: No supraclavicular adenopathy.

Aortic and branch vessel atherosclerosis. Bovine arch. Normal heart
size. Trace anterior pericardial fluid or thickening. Likely
physiologic. No central pulmonary embolism, on this non-dedicated
study. No mediastinal or hilar adenopathy.

Lungs/Pleura: Narrowing of the right upper lobe segmental bronchi
secondary to tumor to be described below. Other airways are within
normal limits.

Mild to moderate centrilobular emphysema.

3 mm right apical lung nodule on image 11.

Central and inferior right upper lobe lung mass. Spiculated,
measuring 3.9 x 2.9 cm on transverse image 25. 3.1 cm craniocaudal
on sagittal image 52. Contacts the right major fissure. Causes mild
volume loss in the anterior right upper lobe.

3 mm lingular nodule on image 42.

No pleural fluid.

Upper abdomen: Normal imaged portions of the liver, spleen, stomach,
pancreas, kidneys. Minimal right adrenal thickening. Left adrenal
gland is enlarged and nodular. Measures 3.2 x 3.5 cm. Maintains its
adreniform shape. Incompletely imaged.

Musculoskeletal: Subchondral cyst formation within the right
glenoid. Incompletely imaged lucency in the C7 vertebral body on
image [DATE] be degenerative. Incompletely imaged.
IMPRESSION: 1. Right upper lobe primary bronchogenic carcinoma.
2. No evidence of thoracic nodal metastasis.
3. Moderate to marked left adrenal enlargement. Although the gland
maintains its adreniform shape, this is suspicious for 1 or more
adrenal metastasis.
4. Incompletely imaged lucency within C7. This could be
degenerative. Isolated osseous metastasis cannot be excluded.
5. Above findings could be further evaluated with PET, if indicated.
